# Patient Record
Sex: Female | Born: 1985 | Race: White | Hispanic: No | Marital: Married | Smoking: Current every day smoker
Health system: Southern US, Community
[De-identification: ages and names within clinical notes are randomized; demographics above are authoritative.]

## PROBLEM LIST (undated history)

## (undated) ENCOUNTER — Inpatient Hospital Stay: Payer: Self-pay

## (undated) HISTORY — PX: KNEE SURGERY: SHX244

## (undated) HISTORY — PX: JOINT REPLACEMENT: SHX530

---
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
## 2010-08-05 ENCOUNTER — Emergency Department: Payer: Self-pay | Admitting: Emergency Medicine

## 2010-11-24 ENCOUNTER — Emergency Department: Payer: Self-pay | Admitting: Unknown Physician Specialty

## 2011-05-19 ENCOUNTER — Observation Stay: Payer: Self-pay | Admitting: Obstetrics and Gynecology

## 2011-05-31 ENCOUNTER — Observation Stay: Payer: Self-pay | Admitting: Obstetrics and Gynecology

## 2011-06-05 ENCOUNTER — Inpatient Hospital Stay: Payer: Self-pay

## 2011-11-05 IMAGING — US US OB < 14 WEEKS
1 series · 17 of 28 positions shown · non-contrast
Comparison: none

REASON FOR EXAM: pregnancy with spotting
COMMENTS:

[Series 1: us ob < 14 weeks · 17 of 94 slices shown]
[im 1/94]
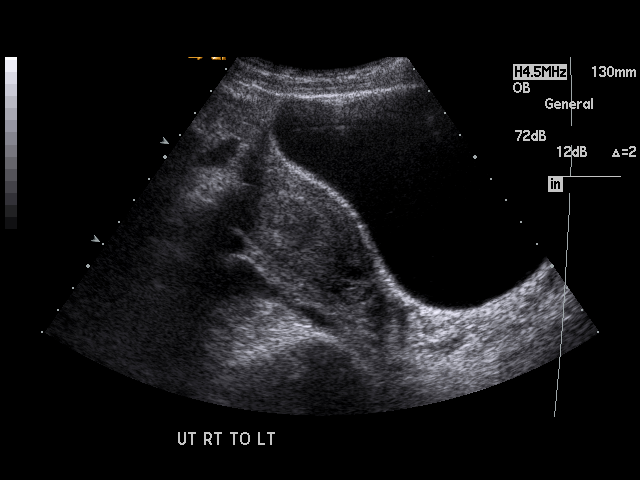
[im 7/94]
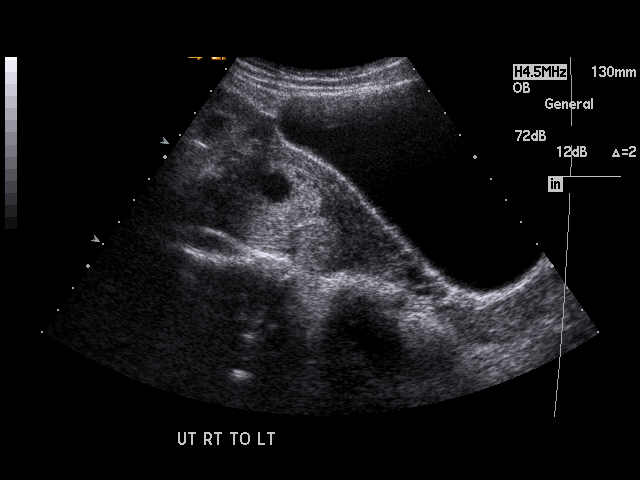
[im 14/94]
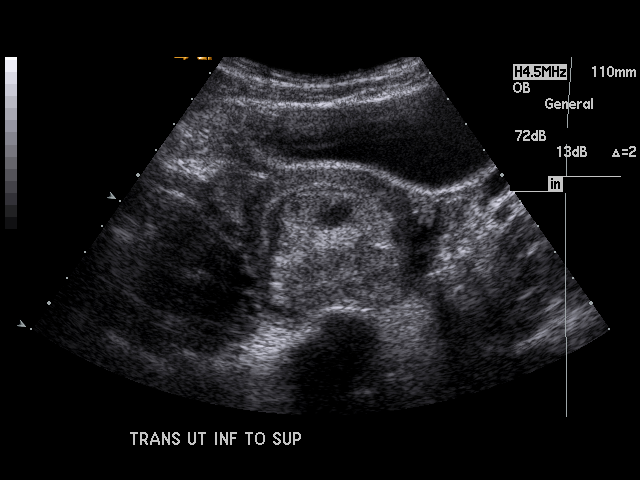
[im 18/94]
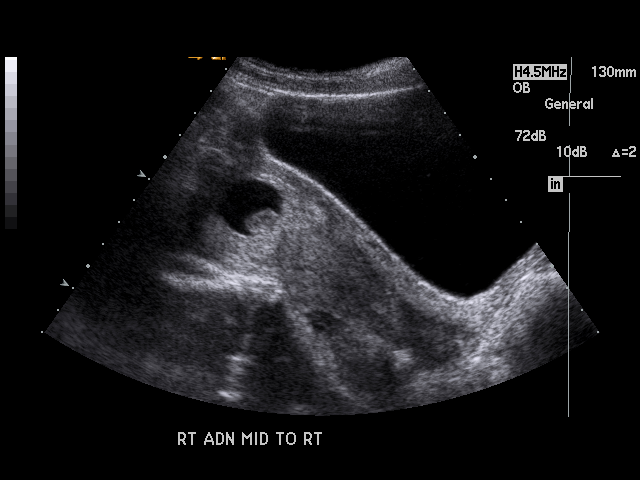
[im 25/94]
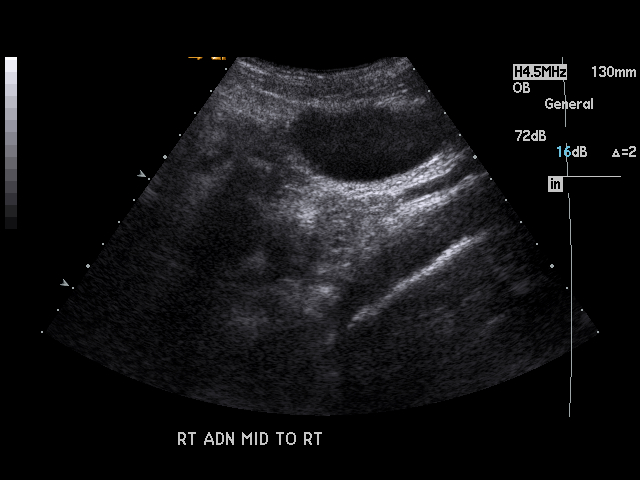
[im 32/94]
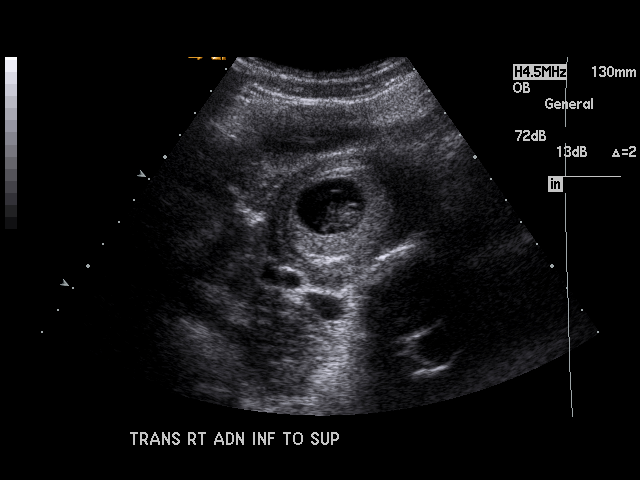
[im 35/94]
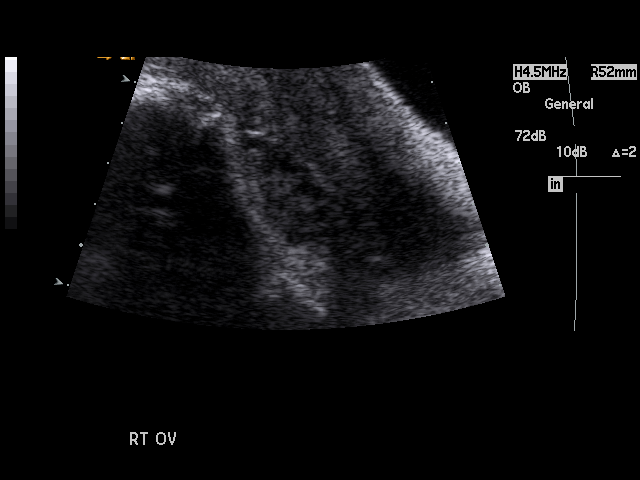
[im 42/94]
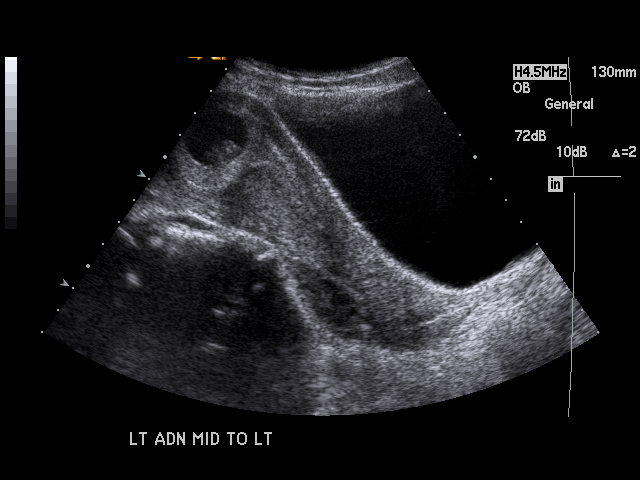
[im 49/94]
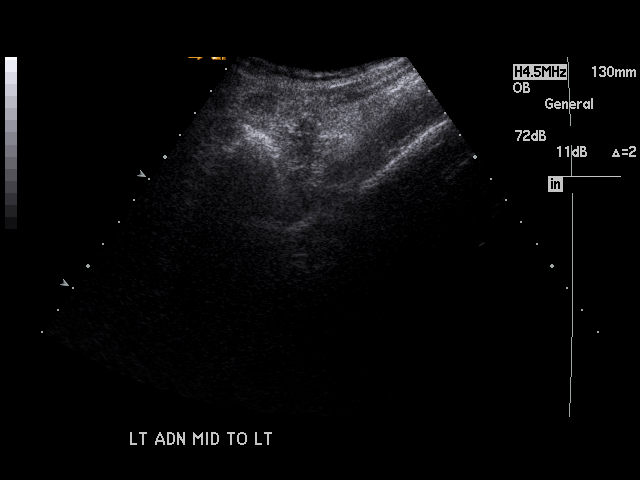
[im 52/94]
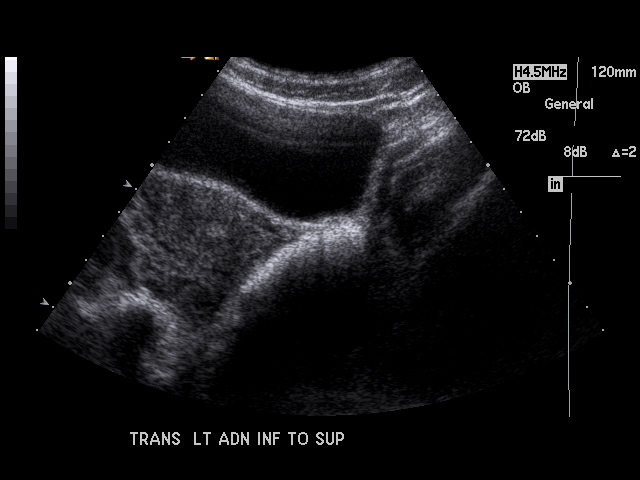
[im 59/94]
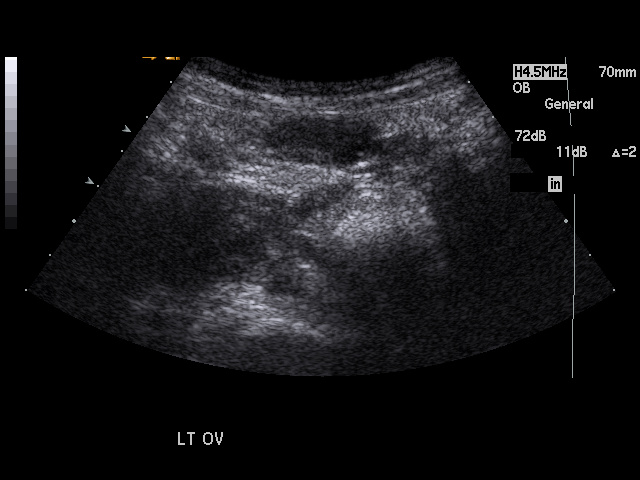
[im 63/94]
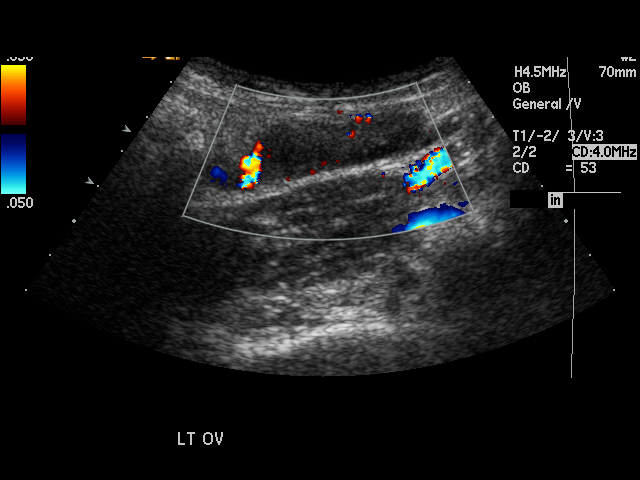
[im 69/94]
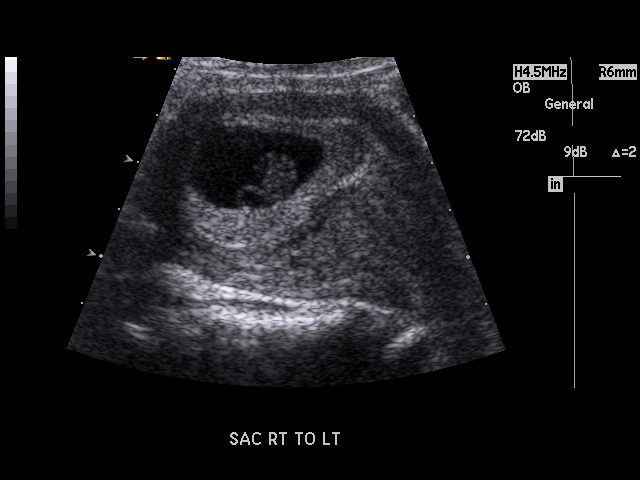
[im 76/94]
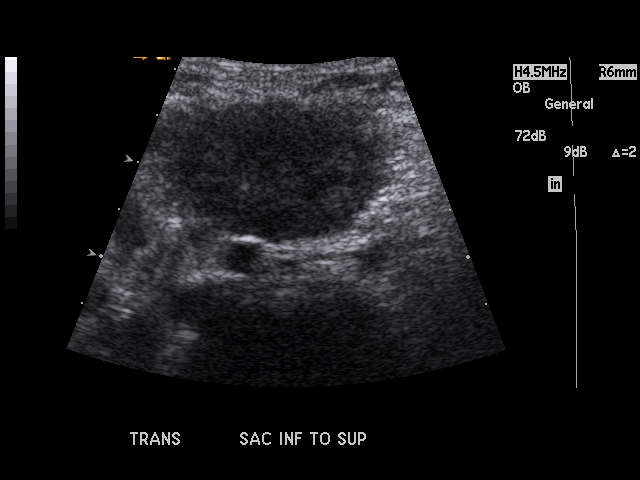
[im 80/94]
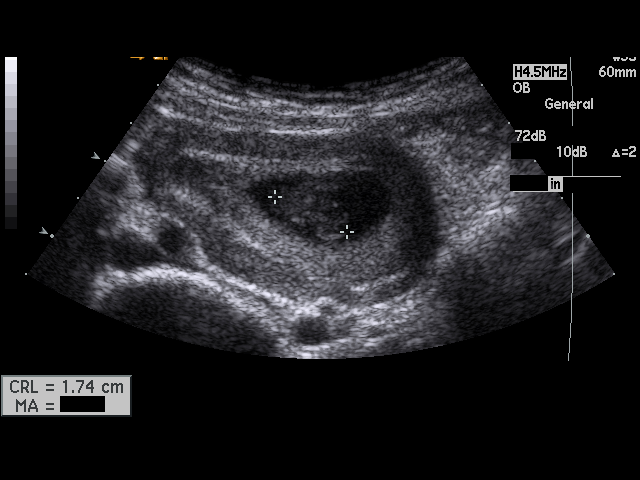
[im 87/94]
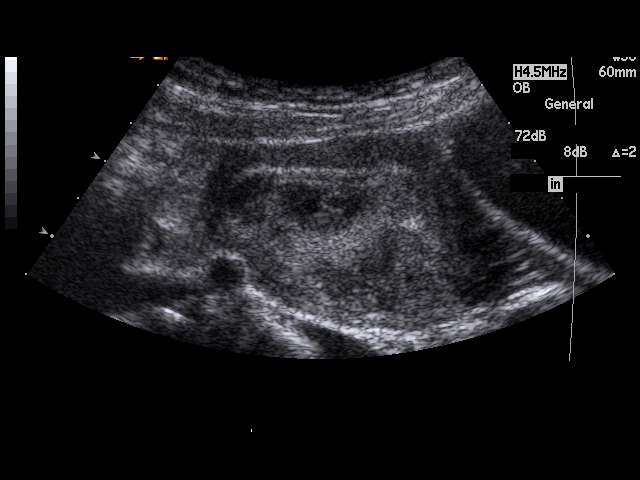
[im 94/94]
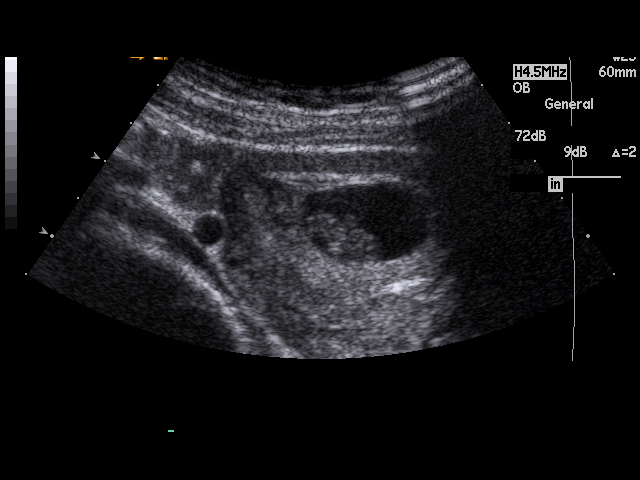

[17 of 28 positions shown; findings below may reference images not displayed]

PROCEDURE:     US  - US OB LESS THAN 14 WEEKS  - November 24, 2010 [DATE]

RESULT:     There is observed a living intrauterine gestation. Embryo heart
rate was monitored at 167 beats per minute. The crown rump length measures
1.79 cm which corresponds to an estimated menstrual age of 8 weeks-1 days.
The yolk sac is visualized. The right and left maternal ovaries are
visualized and are normal in appearance. No abnormal adnexal masses are seen
in the maternal pelvis.
IMPRESSION: 1.     There is a living intrauterine gestation of approximately 8 weeks-1
days menstrual age. Ultrasound estimated EDD based on crown-rump length is
July 04, 2011.

## 2013-09-04 ENCOUNTER — Emergency Department: Payer: Self-pay | Admitting: Emergency Medicine

## 2013-09-05 LAB — COMPREHENSIVE METABOLIC PANEL
ANION GAP: 3 — AB (ref 7–16)
Albumin: 4.6 g/dL (ref 3.4–5.0)
Alkaline Phosphatase: 72 U/L
BUN: 7 mg/dL (ref 7–18)
Bilirubin,Total: 0.4 mg/dL (ref 0.2–1.0)
Calcium, Total: 9.4 mg/dL (ref 8.5–10.1)
Chloride: 107 mmol/L (ref 98–107)
Co2: 31 mmol/L (ref 21–32)
Creatinine: 0.67 mg/dL (ref 0.60–1.30)
EGFR (African American): >60
EGFR (Non-African Amer.): >60
Glucose: 82 mg/dL (ref 65–99)
Osmolality: 278 (ref 275–301)
POTASSIUM: 3.6 mmol/L (ref 3.5–5.1)
SGOT(AST): 12 U/L — ABNORMAL LOW (ref 15–37)
SGPT (ALT): 24 U/L (ref 12–78)
SODIUM: 141 mmol/L (ref 136–145)
Total Protein: 8.3 g/dL — ABNORMAL HIGH (ref 6.4–8.2)

## 2013-09-05 LAB — URINALYSIS, COMPLETE
BACTERIA: NONE SEEN
BLOOD: NEGATIVE
Bilirubin,UR: NEGATIVE
GLUCOSE, UR: NEGATIVE mg/dL (ref 0–75)
Ketone: NEGATIVE
LEUKOCYTE ESTERASE: NEGATIVE
Nitrite: NEGATIVE
Ph: 7 (ref 4.5–8.0)
Protein: NEGATIVE
RBC, UR: NONE SEEN /HPF (ref 0–5)
SPECIFIC GRAVITY: 1.004 (ref 1.003–1.030)

## 2013-09-05 LAB — CBC WITH DIFFERENTIAL/PLATELET
BASOS PCT: 0.6 %
Basophil #: 0.1 10*3/uL (ref 0.0–0.1)
EOS ABS: 0.1 10*3/uL (ref 0.0–0.7)
Eosinophil %: 1.6 %
HCT: 41.4 % (ref 35.0–47.0)
HGB: 13.6 g/dL (ref 12.0–16.0)
Lymphocyte #: 2.2 10*3/uL (ref 1.0–3.6)
Lymphocyte %: 23.4 %
MCH: 30.9 pg (ref 26.0–34.0)
MCHC: 33 g/dL (ref 32.0–36.0)
MCV: 94 fL (ref 80–100)
MONO ABS: 0.7 x10 3/mm (ref 0.2–0.9)
MONOS PCT: 7.5 %
NEUTROS ABS: 6.3 10*3/uL (ref 1.4–6.5)
Neutrophil %: 66.9 %
PLATELETS: 221 10*3/uL (ref 150–440)
RBC: 4.42 10*6/uL (ref 3.80–5.20)
RDW: 14.3 % (ref 11.5–14.5)
WBC: 9.4 10*3/uL (ref 3.6–11.0)

## 2013-09-05 LAB — LIPASE, BLOOD: Lipase: 92 U/L (ref 73–393)

## 2016-06-09 DIAGNOSIS — D069 Carcinoma in situ of cervix, unspecified: Secondary | ICD-10-CM | POA: Insufficient documentation

## 2017-06-15 ENCOUNTER — Ambulatory Visit: Payer: Self-pay | Admitting: Family Medicine

## 2019-05-19 ENCOUNTER — Other Ambulatory Visit: Payer: Self-pay

## 2019-05-19 DIAGNOSIS — Z1231 Encounter for screening mammogram for malignant neoplasm of breast: Secondary | ICD-10-CM

## 2019-09-05 DIAGNOSIS — F419 Anxiety disorder, unspecified: Secondary | ICD-10-CM | POA: Insufficient documentation

## 2021-01-31 ENCOUNTER — Encounter: Payer: Self-pay | Admitting: Emergency Medicine

## 2021-01-31 ENCOUNTER — Emergency Department
Admission: EM | Admit: 2021-01-31 | Discharge: 2021-01-31 | Disposition: A | Discharge status: Home / Self Care | Payer: Medicaid Other | Attending: Emergency Medicine | Admitting: Emergency Medicine

## 2021-01-31 ENCOUNTER — Other Ambulatory Visit: Payer: Self-pay

## 2021-01-31 ENCOUNTER — Ambulatory Visit
Admit: 2021-01-31 | Discharge: 2021-01-31 | Disposition: A | Discharge status: Home / Self Care | Payer: Medicaid Other | Attending: Internal Medicine | Admitting: Internal Medicine

## 2021-01-31 ENCOUNTER — Other Ambulatory Visit: Payer: Self-pay | Admitting: Obstetrics & Gynecology

## 2021-01-31 ENCOUNTER — Ambulatory Visit
Admission: EM | Admit: 2021-01-31 | Discharge: 2021-01-31 | Disposition: A | Discharge status: Home / Self Care | Payer: Medicaid Other | Attending: Physician Assistant | Admitting: Physician Assistant

## 2021-01-31 DIAGNOSIS — Z3201 Encounter for pregnancy test, result positive: Secondary | ICD-10-CM | POA: Insufficient documentation

## 2021-01-31 DIAGNOSIS — N9489 Other specified conditions associated with female genital organs and menstrual cycle: Secondary | ICD-10-CM | POA: Diagnosis not present

## 2021-01-31 DIAGNOSIS — Z20822 Contact with and (suspected) exposure to covid-19: Secondary | ICD-10-CM | POA: Diagnosis not present

## 2021-01-31 DIAGNOSIS — O26859 Spotting complicating pregnancy, unspecified trimester: Secondary | ICD-10-CM | POA: Diagnosis present

## 2021-01-31 DIAGNOSIS — N838 Other noninflammatory disorders of ovary, fallopian tube and broad ligament: Secondary | ICD-10-CM | POA: Insufficient documentation

## 2021-01-31 DIAGNOSIS — O209 Hemorrhage in early pregnancy, unspecified: Secondary | ICD-10-CM | POA: Insufficient documentation

## 2021-01-31 DIAGNOSIS — Z3A08 8 weeks gestation of pregnancy: Secondary | ICD-10-CM | POA: Diagnosis not present

## 2021-01-31 DIAGNOSIS — Z87891 Personal history of nicotine dependence: Secondary | ICD-10-CM | POA: Insufficient documentation

## 2021-01-31 DIAGNOSIS — O26851 Spotting complicating pregnancy, first trimester: Secondary | ICD-10-CM | POA: Diagnosis not present

## 2021-01-31 DIAGNOSIS — O009 Unspecified ectopic pregnancy without intrauterine pregnancy: Secondary | ICD-10-CM | POA: Diagnosis present

## 2021-01-31 DIAGNOSIS — N939 Abnormal uterine and vaginal bleeding, unspecified: Secondary | ICD-10-CM | POA: Diagnosis present

## 2021-01-31 LAB — CBC WITH DIFFERENTIAL/PLATELET
Abs Immature Granulocytes: 0.03 10*3/uL (ref 0.00–0.07)
Basophils Absolute: 0.1 10*3/uL (ref 0.0–0.1)
Basophils Relative: 1 %
Eosinophils Absolute: 0.1 10*3/uL (ref 0.0–0.5)
Eosinophils Relative: 1 %
HCT: 41.7 % (ref 36.0–46.0)
Hemoglobin: 14.3 g/dL (ref 12.0–15.0)
Immature Granulocytes: 0 %
Lymphocytes Relative: 24 %
Lymphs Abs: 2.5 10*3/uL (ref 0.7–4.0)
MCH: 31.4 pg (ref 26.0–34.0)
MCHC: 34.3 g/dL (ref 30.0–36.0)
MCV: 91.6 fL (ref 80.0–100.0)
Monocytes Absolute: 0.5 10*3/uL (ref 0.1–1.0)
Monocytes Relative: 5 %
Neutro Abs: 7.1 10*3/uL (ref 1.7–7.7)
Neutrophils Relative %: 69 %
Platelets: 218 10*3/uL (ref 150–400)
RBC: 4.55 MIL/uL (ref 3.87–5.11)
RDW: 12.4 % (ref 11.5–15.5)
WBC: 10.2 10*3/uL (ref 4.0–10.5)
nRBC: 0 % (ref 0.0–0.2)

## 2021-01-31 LAB — COMPREHENSIVE METABOLIC PANEL
ALT: 21 U/L (ref 0–44)
AST: 20 U/L (ref 15–41)
Albumin: 5.3 g/dL — ABNORMAL HIGH (ref 3.5–5.0)
Alkaline Phosphatase: 48 U/L (ref 38–126)
Anion gap: 6 (ref 5–15)
BUN: 10 mg/dL (ref 6–20)
CO2: 23 mmol/L (ref 22–32)
Calcium: 9.2 mg/dL (ref 8.9–10.3)
Chloride: 108 mmol/L (ref 98–111)
Creatinine, Ser: 0.72 mg/dL (ref 0.44–1.00)
GFR, Estimated: >60 mL/min (ref >60)
Glucose, Bld: 107 mg/dL — ABNORMAL HIGH (ref 70–99)
Potassium: 3.4 mmol/L — ABNORMAL LOW (ref 3.5–5.1)
Sodium: 137 mmol/L (ref 135–145)
Total Bilirubin: 1.3 mg/dL — ABNORMAL HIGH (ref 0.3–1.2)
Total Protein: 8 g/dL (ref 6.5–8.1)

## 2021-01-31 LAB — RESP PANEL BY RT-PCR (FLU A&B, COVID) ARPGX2
Influenza A by PCR: NEGATIVE
Influenza B by PCR: NEGATIVE
SARS Coronavirus 2 by RT PCR: NEGATIVE

## 2021-01-31 LAB — HCG, QUANTITATIVE, PREGNANCY: hCG, Beta Chain, Quant, S: 307 m[IU]/mL — ABNORMAL HIGH (ref <5)

## 2021-01-31 LAB — PREGNANCY, URINE: Preg Test, Ur: POSITIVE — AB

## 2021-01-31 NOTE — ED Triage Notes (Signed)
Pt arrives POV. D/T pain in lower pelvic region pt presented Urgent Care where provider stated ectopic pregnancy and referred to ED. Pt states dull discomfort in rt pelvic area.

## 2021-01-31 NOTE — Progress Notes (Signed)
Called by Dr. Scotty Court regarding patient.  Patient had a pelvic ultrasound performed at outside urgent care for abdominal pain in the setting of early pregnancy.  Patient's ultrasound showed a suspicious right adnexal mass however a fetal pole and fetal heart rate were not seen.  Images were not able to be reviewed as they are not loaded.  Beta-hCG was 307.  Patient reports the first day of her last menstrual period was 01/19/2021.  She reports that she took a home pregnancy test on 01/21/2021 which was negative.  She reports that her first positive pregnancy test was on 01/28/2021.  This is a desired pregnancy.  She is not currently having severe pain.  Her vital signs are stable.  Her hemoglobin was stable.  No evidence of hemoperitoneum was seen on the ultrasound.  We reviewed in detail the ultrasound results and her beta hCG.  We discussed the risks associated with ectopic pregnancy and discussed ectopic pregnancy precautions including pelvic rest..  She currently has a pregnancy of unknown location.  We discussed that she will need to follow-up for repeat laboratory testing in 48 hours to assess how the beta-hCG hormone is rising.  If it is rising in a normal fashion it would need to be monitored at regular intervals and a follow-up ultrasound will be planned in 1 to 2 weeks.  If there is an abnormality in the rate of beta-hCG hormone rise methotrexate or surgery can be considered.    If there is any change to her pain, any dizziness or lightheadedness, any fainting or any heavy vaginal bleeding she is to return urgently to the emergency room.We discussed that ectopic pregnancies can be life threatening and that regular follow up will be needed. She did not express limitations to follow up.   Will plan to see patient tomorrow in office at 1:15 PM and will arrange repeat beta hcg testing and MTX therapy/surgery if needed.  Adelene Idler MD, [facility], [facility] 01/31/2021 7:54 PM

## 2021-01-31 NOTE — ED Notes (Signed)
No prior auth required per Pt insurance.

## 2021-01-31 NOTE — ED Notes (Signed)
MD Scotty Court made aware of pt triage CC and status at this time.

## 2021-01-31 NOTE — ED Provider Notes (Signed)
[facility]    CSN: [id number] Arrival date & time: 01/31/21  1332      History   Chief Complaint Chief Complaint  Patient presents with   Possible Pregnancy    HPI Cynthia Jackson is a 35 y.o. female.   Patient is a 35 year old female G2, P2 who presents with possible pregnancy.  Patient reports she has had "weird "menstrual cycles recently.  She states June 15 with her last regular period.  She states she had a "weird ".  Around August 3 that was very light.  She states she took a pregnancy test this past Friday, 8/12, that returned positive.  She states she has had some light spotting since then, reporting a few drops of blood over the time she goes the bathroom.  She also reports some lower abdominal pain on the right last week but nothing last couple days.  Patient denies any complications with her previous 2 pregnancies but states that her last child was born 3 weeks early.  Patient states she has an appointment with Dr. Bonney Aid at [facility] on 8/24 to establish care.   History reviewed. No pertinent past medical history.  There are no problems to display for this patient.   Past Surgical History:  Procedure Laterality Date   KNEE SURGERY Left     OB History     Gravida  1   Para      Term      Preterm      AB      Living         SAB      IAB      Ectopic      Multiple      Live Births               Home Medications    Prior to Admission medications   Not on File    Family History No family history on file.  Social History Social History   Tobacco Use   Smoking status: Former    Types: Cigarettes   Smokeless tobacco: Never  Vaping Use   Vaping Use: Never used  Substance Use Topics   Alcohol use: Not Currently   Drug use: Not Currently    Types: Marijuana     Allergies   Patient has no known allergies.   Review of Systems Review of Systems as noted above in HPI.  Other systems reviewed and found  to be negative   Physical Exam Triage Vital Signs ED Triage Vitals  Enc Vitals Group     BP 01/31/21 1427 124/69     Pulse Rate 01/31/21 1427 63     Resp 01/31/21 1427 18     Temp 01/31/21 1427 98.5 F (36.9 C)     Temp Source 01/31/21 1427 Oral     SpO2 01/31/21 1427 100 %     Weight 01/31/21 1424 110 lb (49.9 kg)     Height 01/31/21 1424 5\' 1"  (1.549 m)     Head Circumference --      Peak Flow --      Pain Score 01/31/21 1424 0     Pain Loc --      Pain Edu? --      Excl. in GC? --    No data found.  Updated Vital Signs BP 124/69 (BP Location: Right Arm)   Pulse 63   Temp 98.5 F (36.9 C) (Oral)   Resp 18  Ht 5\' 1"  (1.549 m)   Wt 110 lb (49.9 kg)   LMP 12/31/2020   SpO2 100%   BMI 20.78 kg/m    Physical Exam Constitutional:      Appearance: Normal appearance. She is normal weight. She is not ill-appearing.  Cardiovascular:     Rate and Rhythm: Normal rate.     Heart sounds: Normal heart sounds.  Pulmonary:     Effort: Pulmonary effort is normal.  Abdominal:     General: Abdomen is flat. There is no distension.     Palpations: Abdomen is soft. There is no mass.     Tenderness: There is no abdominal tenderness. There is no guarding or rebound.  Skin:    General: Skin is warm and dry.     Capillary Refill: Capillary refill takes less than 2 seconds.  Neurological:     General: No focal deficit present.     Mental Status: She is alert and oriented to person, place, and time.  Psychiatric:        Mood and Affect: Mood normal.        Behavior: Behavior normal.     UC Treatments / Results  Labs (all labs ordered are listed, but only abnormal results are displayed) Labs Reviewed  PREGNANCY, URINE - Abnormal; Notable for the following components:      Result Value   Preg Test, Ur POSITIVE (*)    All other components within normal limits    EKG   Radiology 01/02/2021 OB Transvaginal  Result Date: 01/31/2021 CLINICAL DATA:  Vaginal bleeding in early  pregnancy for 3 days. EXAM: TRANSVAGINAL OB ULTRASOUND TECHNIQUE: Transvaginal ultrasound was performed for complete evaluation of the gestation as well as the maternal uterus, adnexal regions, and pelvic cul-de-sac. COMPARISON:  None. FINDINGS: Intrauterine gestational sac: None Maternal uterus/adnexae: Endometrial thickness measures 4 mm. No fibroids identified. Both ovaries are normal in appearance. A small heterogeneous mass is seen in the right adnexa which is separate from the ovary. This measures 2.1 x 1.4 x 1.2 cm. No evidence of free fluid. IMPRESSION: 2.1 cm right adnexal mass, highly suspicious for ectopic pregnancy. No evidence of hemoperitoneum. Critical Value/emergent results were called by telephone at the time of interpretation on 01/31/2021 at 5:10 pm to provider [facility], who verbally acknowledged these results. Electronically Signed   By: [facility] M.D.   On: 01/31/2021 17:10    Procedures Procedures (including critical care time)  Medications Ordered in UC Medications - No data to display  Initial Impression / Assessment and Plan / UC Course  I have reviewed the triage vital signs and the nursing notes.  Pertinent labs & imaging results that were available during my care of the patient were reviewed by me and considered in my medical decision making (see chart for details).    Patient presents with chief complaint irregular periods and a positive urine test.  Patient states she had an episode of right lower quadrant abdominal pain last week but nothing over the last couple days.  Patient also reports intermittent spotting last few days with a few drops of blood every other time she goes to the bathroom.  Patient reports no complications from her 2 previous 2 pregnancies. LMP of 6/15 gives gestational age of 8w 5d and [facility] 09/07/2021.  Spoke with Dr. 09/09/2021 with [facility] as Dr. [facility] is out of the office. He said some spotting can be normal but cannot rule out a  miscarriage. He recommended ordering  OB US and to keep her appointment for 8/24.   [place] as above concerning for unruptured ectopic pregnancy.  Patient notified and advised of need to go to the ER for immediate treatment.  She was advised of this being a life-threatening event should this rupture with her outside of the hospital.  Patient is understanding.  Visibly upset stating that they have been trying 10 units for another child.  She will be having a friend come to take her directly to the hospital.  I have offered her an ambulance as I do not believe it is safe for her to drive herself.  Final Clinical Impressions(s) / UC Diagnoses   Final diagnoses:  Positive pregnancy test  Spotting during pregnancy  Ectopic pregnancy without intrauterine pregnancy, unspecified location     Discharge Instructions      -An ectopic pregnancy was noted on the [place] but there or no signs of rupture at this time.  -Will need to go immediately to the ER for treatment.     ED Prescriptions   None    PDMP not reviewed this encounter.   Candis Schatz, PA-C 01/31/21 1724

## 2021-01-31 NOTE — Discharge Instructions (Addendum)
-  An ectopic pregnancy was noted on the [place] but there or no signs of rupture at this time.  -Will need to go immediately to the ER for treatment.

## 2021-01-31 NOTE — ED Triage Notes (Signed)
Pt states she took a home pregnancy test and was positive on 01/28/21. She states she started bleeding the same day. She states she is not having heavy bleeding but is concerned.

## 2021-01-31 NOTE — Discharge Instructions (Addendum)
Please follow up with [facility] tomorrow to continue evaluating your pregnancy and monitoring your symptoms.

## 2021-01-31 NOTE — ED Provider Notes (Signed)
[facility] Emergency Department Provider Note  ____________________________________________  Time seen: Approximately 8:20 PM  I have reviewed the triage vital signs and the nursing notes.   HISTORY  Chief Complaint Ectopic Pregnancy    HPI Cynthia Jackson is a 35 y.o. female who complains of low abdominal pain and light vaginal bleeding for the past 3 days.  She was seen in urgent care earlier today, found to be pregnant, ultrasound showed right adnexal mass and no IUP, she was sent to the emergency department for evaluation of possible ectopic pregnancy.  No fever chest pain shortness of breath.  No blood thinner use.  No dysuria.  This is a desired pregnancy.  LMP was June 15    History reviewed. No pertinent past medical history.   There are no problems to display for this patient.    Past Surgical History:  Procedure Laterality Date   KNEE SURGERY Left      Prior to Admission medications   Not on File     Allergies Other   History reviewed. No pertinent family history.  Social History Social History   Tobacco Use   Smoking status: Former    Packs/day: 0.50    Years: 32.00    Pack years: 16.00    Types: Cigarettes    Quit date: 01/28/2021   Smokeless tobacco: Never  Vaping Use   Vaping Use: Never used  Substance Use Topics   Alcohol use: Never   Drug use: Never    Types: Marijuana    Review of Systems  Constitutional:   No fever or chills.  ENT:   No sore throat. No rhinorrhea. Cardiovascular:   No chest pain or syncope. Respiratory:   No dyspnea or cough. Gastrointestinal:   Positive for abdominal pain without vomiting and diarrhea.  Musculoskeletal:   Negative for focal pain or swelling All other systems reviewed and are negative except as documented above in ROS and HPI.  ____________________________________________   PHYSICAL EXAM:  VITAL SIGNS: ED Triage Vitals  Enc Vitals Group     BP 01/31/21  1816 (!) 123/59     Pulse Rate 01/31/21 1816 83     Resp 01/31/21 1816 15     Temp 01/31/21 1816 98.5 F (36.9 C)     Temp Source 01/31/21 1816 Oral     SpO2 01/31/21 1816 100 %     Weight 01/31/21 1817 110 lb (49.9 kg)     Height 01/31/21 1817 5\' 1"  (1.549 m)     Head Circumference --      Peak Flow --      Pain Score 01/31/21 1817 0     Pain Loc --      Pain Edu? --      Excl. in GC? --     Vital signs reviewed, nursing assessments reviewed.   Constitutional:   Alert and oriented. Non-toxic appearance. Eyes:   Conjunctivae are normal. EOMI.  ENT      Head:   Normocephalic and atraumatic.      Nose:   Wearing a mask.      Mouth/Throat:   Wearing a mask.      Neck:   No meningismus. Full ROM. Cardiovascular:   RRR. Cap refill less than 2 seconds. Respiratory:   Normal respiratory effort without tachypnea/retractions.  Gastrointestinal:   Soft and nontender. Non distended.   No rebound, rigidity, or guarding.  Musculoskeletal:   Normal range of motion in all extremities. No joint  effusions.  No lower extremity tenderness.  No edema. Neurologic:   Normal speech and language.  Motor grossly intact. No acute focal neurologic deficits are appreciated.  Skin:    Skin is warm, dry and intact. No rash noted.  No petechiae, purpura, or bullae.  ____________________________________________    LABS (pertinent positives/negatives) (all labs ordered are listed, but only abnormal results are displayed) Labs Reviewed  COMPREHENSIVE METABOLIC PANEL - Abnormal; Notable for the following components:      Result Value   Potassium 3.4 (*)    Glucose, Bld 107 (*)    Albumin 5.3 (*)    Total Bilirubin 1.3 (*)    All other components within normal limits  HCG, QUANTITATIVE, PREGNANCY - Abnormal; Notable for the following components:   hCG, Beta Chain, Quant, S 307 (*)    All other components within normal limits  RESP PANEL BY RT-PCR (FLU A&B, COVID) ARPGX2  CBC WITH  DIFFERENTIAL/PLATELET  TYPE AND SCREEN  TYPE AND SCREEN   ____________________________________________   EKG    ____________________________________________    RADIOLOGY  US OB Transvaginal  Result Date: 01/31/2021 CLINICAL DATA:  Vaginal bleeding in early pregnancy for 3 days. EXAM: TRANSVAGINAL OB ULTRASOUND TECHNIQUE: Transvaginal ultrasound was performed for complete evaluation of the gestation as well as the maternal uterus, adnexal regions, and pelvic cul-de-sac. COMPARISON:  None. FINDINGS: Intrauterine gestational sac: None Maternal uterus/adnexae: Endometrial thickness measures 4 mm. No fibroids identified. Both ovaries are normal in appearance. A small heterogeneous mass is seen in the right adnexa which is separate from the ovary. This measures 2.1 x 1.4 x 1.2 cm. No evidence of free fluid. IMPRESSION: 2.1 cm right adnexal mass, highly suspicious for ectopic pregnancy. No evidence of hemoperitoneum. Critical Value/emergent results were called by telephone at the time of interpretation on 01/31/2021 at 5:10 pm to provider [facility], who verbally acknowledged these results. Electronically Signed   By: Danae Orleans M.D.   On: 01/31/2021 17:10    ____________________________________________   PROCEDURES Procedures  ____________________________________________    CLINICAL IMPRESSION / ASSESSMENT AND PLAN / ED COURSE  Medications ordered in the ED: Medications - No data to display  Pertinent labs & imaging results that were available during my care of the patient were reviewed by me and considered in my medical decision making (see chart for details).  Cynthia Jackson was evaluated in Emergency Department on 01/31/2021 for the symptoms described in the history of present illness. She was evaluated in the context of the global COVID-19 pandemic, which necessitated consideration that the patient might be at risk for infection with the SARS-CoV-2 virus that causes  COVID-19. Institutional protocols and algorithms that pertain to the evaluation of patients at risk for COVID-19 are in a state of rapid change based on information released by regulatory bodies including the CDC and federal and state organizations. These policies and algorithms were followed during the patient's care in the ED.   Patient presents with outside ultrasound showing right adnexal mass measuring 2 cm.  No free fluid  Clinical Course as of 01/31/21 2020  Mon Jan 31, 2021  1846 Case discussed with OB, will need to wait for hCG level to return. [PS]    Clinical Course User Index [PS] Sharman Cheek, MD    ----------------------------------------- 8:22 PM on 01/31/2021 ----------------------------------------- Case discussed with radiology who notes that the right adnexal mass appears nondescript soft tissue, no fetal pole or cardiac difficulty or definitive embryo structures.  However, they do feel  it is highly likely to be ectopic/tubal pregnancy.  Discussed with Dr. Gaynelle Arabian from [facility] who had extensive conversation with the patient.  She will plan to see them in clinic tomorrow, repeat hCG and ultrasound and further counseled on.  Vital signs are normal, hemoglobin is normal, there is no free fluid, no evidence of rupture.  Pain is mild and patient is ambulatory, she is stable for outpatient follow-up at this time, return precautions have been discussed.   ____________________________________________   FINAL CLINICAL IMPRESSION(S) / ED DIAGNOSES    Final diagnoses:  Adnexal mass     ED Discharge Orders     None       Portions of this note were generated with dragon dictation software. Dictation errors may occur despite best attempts at proofreading.    Sharman Cheek, MD 01/31/21 2024

## 2021-02-01 ENCOUNTER — Telehealth: Payer: Self-pay

## 2021-02-01 ENCOUNTER — Encounter: Payer: Self-pay | Admitting: Obstetrics and Gynecology

## 2021-02-01 ENCOUNTER — Other Ambulatory Visit (HOSPITAL_COMMUNITY)
Admission: RE | Admit: 2021-02-01 | Discharge: 2021-02-01 | Disposition: A | Discharge status: Home / Self Care | Payer: Medicaid Other | Source: Ambulatory Visit | Attending: Obstetrics and Gynecology | Admitting: Obstetrics and Gynecology

## 2021-02-01 ENCOUNTER — Ambulatory Visit (INDEPENDENT_AMBULATORY_CARE_PROVIDER_SITE_OTHER): Payer: Medicaid Other | Admitting: Obstetrics and Gynecology

## 2021-02-01 VITALS — BP 102/68 | Ht 61.0 in | Wt 106.4 lb

## 2021-02-01 DIAGNOSIS — O3680X0 Pregnancy with inconclusive fetal viability, not applicable or unspecified: Secondary | ICD-10-CM

## 2021-02-01 DIAGNOSIS — Z124 Encounter for screening for malignant neoplasm of cervix: Secondary | ICD-10-CM | POA: Diagnosis not present

## 2021-02-01 NOTE — Telephone Encounter (Signed)
Attempt to reach patient via phone. Voicemail is not set up unable to leave message 

## 2021-02-01 NOTE — Patient Instructions (Addendum)
Enter thru the [facility], 2PM appointment in the PACU/PreOp area   Methotrexate Treatment for an Ectopic Pregnancy Methotrexate is a medicine that treats an ectopic pregnancy. In this type of pregnancy, the fertilized egg attaches (implants) outside the uterus. An ectopic pregnancy cannot develop into a healthy baby. Methotrexate works by stopping the growth of the fertilized egg. It also helpsthe body absorb tissue from the egg. This takes about 2-6 weeks. An ectopic pregnancy can be life-threatening. However, most ectopic pregnanciescan be successfully treated with methotrexate if they are diagnosed early. Tell a health care provider about: Any allergies you have. All medicines you are taking, including vitamins, herbs, eye drops, creams, and over-the-counter medicines. Any medical conditions you have. What are the risks? Generally, this is a safe treatment. However, problems may occur, including: Digestive problems. You may have: Nausea. Vomiting. Diarrhea. Cramping in your abdomen. Bleeding or spotting from your vagina. Feeling dizzy or light-headed. Mouth sores. Inflammation of the lining of your lungs (pneumonitis). Damage to nearby structures or organs, such as damage to the liver. Hair loss. There is a risk that methotrexate treatment will fail and the pregnancy will continue. There is also a risk that the ectopic pregnancy might tear or burst (rupture) during use of this medicine. What happens before the procedure? Blood tests will be done to check how your disease-fighting system (immune system), liver, and kidneys are working. You will also have blood tests to measure your pregnancy hormone levels and to find out your blood type. You will be given a shot of a medicine called Rho(D) immune globulin if: You are Rh-negative and the father is Rh-positive. You are Rh-negative and the father's Rh type is unknown. What happens during the procedure? Methotrexate will be  injected into your muscle. Methotrexate may be given as a single dose of medicine or a series of doses over time, depending on your response to the treatment. Methotrexate injections are given by a health care provider. Injection is the most common way that this medicine is used to treat an ectopic pregnancy. You may also receive other medicines to manage your ectopic pregnancy. The procedure may vary among health care providers and hospitals. What can I expect after treatment? After your treatment, it is common to have: Cramping in your abdomen. Bleeding in your vagina. Tiredness (fatigue). Nausea. Vomiting. Diarrhea. Blood tests will be done at timed intervals for several days or weeks to check your pregnancy hormone levels. The blood tests will be done until the pregnancyhormone can no longer be found in the blood. If the methotrexate treatment does not work, a surgical procedure may be doneto remove the ectopic pregnancy. Follow these instructions at home:  Medicines Take over-the-counter and prescription medicines only as told by your health care provider. Do not take prescription pain medicines, aspirin, ibuprofen, naproxen, or any other NSAIDs. Do not take folic acid, prenatal vitamins, or other vitamins that contain folic acid. Activity Do not have sex, douche, or put anything, such as tampons, in your vagina until your health care provider says it is okay. Limit activities that take a lot of effort as told by your health care provider. General instructions Do not drink alcohol. Follow instructions from your health care provider about eating restrictions, such as avoiding foods that produce a lot of gas. These foods can hide the signs of a ruptured ectopic pregnancy. Limit exposure to sunlight or artificial UV light such as from tanning beds. Methotrexate can make you more sensitive to the sun.  Follow instructions from your health care provider on how and when to report any  symptoms that may indicate a ruptured ectopic pregnancy. Keep all follow-up visits. This is important. Contact a health care provider if: You have persistent nausea and vomiting. You have persistent diarrhea. You are having a reaction to the medicine. This may include: Unusual fatigue. Skin rash. Get help right away if: Pain in your abdomen or in the area between your hip bones (pelvic area) gets worse. You have more bleeding from your vagina. You feel light-headed or you faint. You are short of breath. Your heart rate increases. You develop a cough. You have chills or a fever. Summary Methotrexate is a medicine that treats an ectopic pregnancy. This type of pregnancy forms outside the uterus. There is a risk that methotrexate treatment will fail and the pregnancy will continue. There is also a risk that the ectopic pregnancy might tear or burst during use of this medicine. This medicine may be given in a single dose or a series of doses over time. After your treatment, blood tests will be done at timed intervals for several days or weeks to check your pregnancy hormone levels. The blood tests will be done until no more pregnancy hormone is found in the blood. This information is not intended to replace advice given to you by your health care provider. Make sure you discuss any questions you have with your healthcare provider. Document Revised: 11/19/2019 Document Reviewed: 11/19/2019 Elsevier Patient Education  2022 [employer].

## 2021-02-01 NOTE — Telephone Encounter (Signed)
Attempt to reach patient via phone. Voicemail is not set up unable to leave message

## 2021-02-01 NOTE — Telephone Encounter (Signed)
-----   Message from Natale Milch, MD sent at 01/31/2021  7:17 PM EDT ----- This patient needs to get worked into my schedule on 02/01/2021 at 1:15 pm.

## 2021-02-01 NOTE — Progress Notes (Signed)
Patient ID: Cynthia Jackson, female   DOB: 02-Jan-1986, 35 y.o.   MRN: [id number]  Reason for Consult: No chief complaint on file.   Referred by No ref. provider found  Subjective:     HPI:  Cynthia Jackson is a 35 y.o. female.  She is following up from the ER yesterday.  She had a pelvic ultrasound at her urgent care which was suggestive of a possible ectopic pregnancy.  She does desire pregnancy and so plan is for repeat beta-hCG tomorrow with decision to then be made regarding methotrexate versus surgery if the value is abnormal.  She is currently feeling well.  She denies severe abdominal pain.  Gynecological History  Patient's last menstrual period was 12/31/2020.  No past medical history on file. No family history on file. Past Surgical History:  Procedure Laterality Date   KNEE SURGERY Left     Short Social History:  Social History   Tobacco Use   Smoking status: Former    Packs/day: 0.50    Years: 32.00    Pack years: 16.00    Types: Cigarettes    Quit date: 01/28/2021    Years since quitting: 0.0   Smokeless tobacco: Never  Substance Use Topics   Alcohol use: Never    Allergies  Allergen Reactions   Other Anaphylaxis    Fresh and vegetables allergy    No current outpatient medications on file.   No current facility-administered medications for this visit.    Review of Systems  Constitutional: Negative for chills, fatigue, fever and unexpected weight change.  HENT: Negative for trouble swallowing.  Eyes: Negative for loss of vision.  Respiratory: Negative for cough, shortness of breath and wheezing.  Cardiovascular: Negative for chest pain, leg swelling, palpitations and syncope.  GI: Negative for abdominal pain, blood in stool, diarrhea, nausea and vomiting.  GU: Negative for difficulty urinating, dysuria, frequency and hematuria.  Musculoskeletal: Negative for back pain, leg pain and joint pain.  Skin: Negative for rash.  Neurological:  Negative for dizziness, headaches, light-headedness, numbness and seizures.  Psychiatric: Negative for behavioral problem, confusion, depressed mood and sleep disturbance.       Objective:  Objective   Vitals:   02/01/21 1316  BP: 102/68  Weight: 106 lb 6.4 oz (48.3 kg)  Height: 5\' 1"  (1.549 m)   Body mass index is 20.1 kg/m.  Physical Exam Vitals and nursing note reviewed. Exam conducted with a chaperone present.  Constitutional:      Appearance: Normal appearance. She is well-developed.  HENT:     Head: Normocephalic and atraumatic.  Eyes:     Extraocular Movements: Extraocular movements intact.     Pupils: Pupils are equal, round, and reactive to light.  Cardiovascular:     Rate and Rhythm: Normal rate and regular rhythm.  Pulmonary:     Effort: Pulmonary effort is normal. No respiratory distress.     Breath sounds: Normal breath sounds.  Abdominal:     General: Abdomen is flat.     Palpations: Abdomen is soft.  Genitourinary:    Comments: External: Normal appearing vulva. No lesions noted.  Speculum examination: Normal appearing cervix. Small blood in the vaginal vault. Bimanual examination: Uterus midline, non-tender, normal in size, shape and contour.  No CMT. No adnexal masses. No adnexal tenderness. Pelvis not fixed.  Breast exam: exam not performed Musculoskeletal:        General: No signs of injury.  Skin:    General: Skin is warm  and dry.  Neurological:     Mental Status: She is alert and oriented to person, place, and time.  Psychiatric:        Behavior: Behavior normal.        Thought Content: Thought content normal.        Judgment: Judgment normal.    Assessment/Plan:     35 year old with pregnancy of unknown location. Reviewed the plan of care and follow-up with the patient.  Patient will have labs performed tomorrow in the PACU area an appointment time was made for her.  We discussed the risks of ectopic pregnancy.  Pelvic rest was strongly  recommended. We reviewed treatment options for ectopic pregnancy. Patient desires tubal conservation if possible.   More than 30 minutes were spent face to face with the patient in the room, reviewing the medical record, labs and images, and coordinating care for the patient. The plan of management was discussed in detail and counseling was provided.    Adelene Idler MD [facility], [facility] 02/01/2021 1:38 PM

## 2021-02-02 ENCOUNTER — Ambulatory Visit
Admission: RE | Admit: 2021-02-02 | Discharge: 2021-02-02 | Disposition: A | Discharge status: Home / Self Care | Payer: Medicaid Other | Source: Ambulatory Visit | Attending: Obstetrics and Gynecology | Admitting: Obstetrics and Gynecology

## 2021-02-02 ENCOUNTER — Other Ambulatory Visit: Payer: Self-pay

## 2021-02-02 DIAGNOSIS — O00101 Right tubal pregnancy without intrauterine pregnancy: Secondary | ICD-10-CM | POA: Diagnosis not present

## 2021-02-02 DIAGNOSIS — Z3A00 Weeks of gestation of pregnancy not specified: Secondary | ICD-10-CM | POA: Insufficient documentation

## 2021-02-02 LAB — CBC WITH DIFFERENTIAL/PLATELET
Abs Immature Granulocytes: 0.01 10*3/uL (ref 0.00–0.07)
Basophils Absolute: 0 10*3/uL (ref 0.0–0.1)
Basophils Relative: 1 %
Eosinophils Absolute: 0.1 10*3/uL (ref 0.0–0.5)
Eosinophils Relative: 1 %
HCT: 39 % (ref 36.0–46.0)
Hemoglobin: 13.4 g/dL (ref 12.0–15.0)
Immature Granulocytes: 0 %
Lymphocytes Relative: 39 %
Lymphs Abs: 2.4 10*3/uL (ref 0.7–4.0)
MCH: 31.5 pg (ref 26.0–34.0)
MCHC: 34.4 g/dL (ref 30.0–36.0)
MCV: 91.8 fL (ref 80.0–100.0)
Monocytes Absolute: 0.4 10*3/uL (ref 0.1–1.0)
Monocytes Relative: 7 %
Neutro Abs: 3.2 10*3/uL (ref 1.7–7.7)
Neutrophils Relative %: 52 %
Platelets: 183 10*3/uL (ref 150–400)
RBC: 4.25 MIL/uL (ref 3.87–5.11)
RDW: 12.3 % (ref 11.5–15.5)
WBC: 6.1 10*3/uL (ref 4.0–10.5)
nRBC: 0 % (ref 0.0–0.2)

## 2021-02-02 LAB — HCG, QUANTITATIVE, PREGNANCY: hCG, Beta Chain, Quant, S: 131 m[IU]/mL — ABNORMAL HIGH (ref <5)

## 2021-02-02 MED ORDER — METHOTREXATE SODIUM CHEMO INJECTION (PF) 50 MG/2ML
50.0000 mg/m2 | Freq: Once | INTRAMUSCULAR | Status: AC
  Administered 2021-02-02: 72 mg via INTRAMUSCULAR
  Filled 2021-02-02: qty 2.88

## 2021-02-02 NOTE — Progress Notes (Signed)
Patients labs back. Called Dr Jerene Pitch, she is speaking with the patient.

## 2021-02-02 NOTE — Discharge Instructions (Signed)
Following Methotrexate Administration for Ectopic Pregnancy  Your physician will obtain follow-up blood work to monitor the effect of the medication.   After receiving methotrexate avoid:  Alcoholic beverages  Vitamins containing folic acid Foods that contain folic acid, including fortified cereal, enriched bread and pasta, peanuts, dark green leafy vegetables, orange juice, and beans  Gas-forming foods  Nonsteroidal antiinflammatory painkillers  Sexual intercourse or any strenuous activity because it may cause the fallopian tube to rupture Do not become pregnant for 3 months to decrease the risk of birth defects.  You may experience side effects, like nausea, vomiting, dizziness, and mouth and lip ulcers.  Most women have abdominal pain a couple of days after the injection.  Notify your obstetric practitioner or return to the emergency department if you develop severe abdominal pain, dizziness or fainting, heavy vaginal bleeding, severe nausea and vomiting, or fever.  Double-flush the toilet with the lid closed for 72 hours after receiving the injection.   

## 2021-02-02 NOTE — Progress Notes (Signed)
Called from preoperative area with beta-hCG result.  Beta-hCG has unfortunately decreased.  This is consistent with miscarriage or ectopic pregnancy.  Given patient's pain and stricture seen in right adnexa we discussed treatment options for ectopic pregnancy.  We discussed possibility of salpingectomy versus methotrexate therapy.  Patient desires conservative therapy with methotrexate so that she can hopefully retain her fallopian tube.  She is available for follow-up with repeat of lab work.  She does not have any known contraindications to methotrexate therapy.  She has been provided with information regarding methotrexate therapy.  She will follow-up for day 4 and day 7 labs on 8/20 and 8/23.  Close follow-up is planned.  Methotrexate was ordered.  Adelene Idler MD, [facility], [facility] 02/02/2021 3:16 PM

## 2021-02-05 ENCOUNTER — Other Ambulatory Visit: Payer: Self-pay

## 2021-02-05 ENCOUNTER — Ambulatory Visit
Admission: RE | Admit: 2021-02-05 | Discharge: 2021-02-05 | Disposition: A | Discharge status: Home / Self Care | Payer: Medicaid Other | Source: Ambulatory Visit | Attending: Obstetrics and Gynecology | Admitting: Obstetrics and Gynecology

## 2021-02-05 DIAGNOSIS — Z01812 Encounter for preprocedural laboratory examination: Secondary | ICD-10-CM | POA: Insufficient documentation

## 2021-02-05 LAB — CBC
HCT: 36.6 % (ref 36.0–46.0)
Hemoglobin: 12.9 g/dL (ref 12.0–15.0)
MCH: 32.1 pg (ref 26.0–34.0)
MCHC: 35.2 g/dL (ref 30.0–36.0)
MCV: 91 fL (ref 80.0–100.0)
Platelets: 177 10*3/uL (ref 150–400)
RBC: 4.02 MIL/uL (ref 3.87–5.11)
RDW: 12.1 % (ref 11.5–15.5)
WBC: 5.2 10*3/uL (ref 4.0–10.5)
nRBC: 0 % (ref 0.0–0.2)

## 2021-02-05 LAB — HCG, QUANTITATIVE, PREGNANCY: hCG, Beta Chain, Quant, S: 51 m[IU]/mL — ABNORMAL HIGH (ref <5)

## 2021-02-05 NOTE — Progress Notes (Signed)
Periop note Medical Day: Pt here for labs (CBC, beta hCG). Pt aware to return for f/u labs in three days. Pt states she feels much better now than last week. Lab results called to Dr. Jerene Pitch. Pt left perioperative area to f/u with Dr. Jerene Pitch.

## 2021-02-07 LAB — CYTOLOGY - PAP
Chlamydia: NEGATIVE
Comment: NEGATIVE
Comment: NEGATIVE
Comment: NEGATIVE
Comment: NEGATIVE
Comment: NORMAL
Diagnosis: HIGH — AB
HPV 16: NEGATIVE
HPV 18 / 45: NEGATIVE
High risk HPV: POSITIVE — AB
Neisseria Gonorrhea: NEGATIVE
Trichomonas: NEGATIVE

## 2021-02-08 ENCOUNTER — Other Ambulatory Visit: Payer: Self-pay

## 2021-02-08 ENCOUNTER — Ambulatory Visit
Admission: RE | Admit: 2021-02-08 | Discharge: 2021-02-08 | Disposition: A | Discharge status: Home / Self Care | Payer: Medicaid Other | Source: Ambulatory Visit | Attending: Obstetrics and Gynecology | Admitting: Obstetrics and Gynecology

## 2021-02-08 DIAGNOSIS — Z01812 Encounter for preprocedural laboratory examination: Secondary | ICD-10-CM | POA: Insufficient documentation

## 2021-02-08 LAB — CBC
HCT: 35.6 % — ABNORMAL LOW (ref 36.0–46.0)
Hemoglobin: 12.1 g/dL (ref 12.0–15.0)
MCH: 31.6 pg (ref 26.0–34.0)
MCHC: 34 g/dL (ref 30.0–36.0)
MCV: 93 fL (ref 80.0–100.0)
Platelets: 158 10*3/uL (ref 150–400)
RBC: 3.83 MIL/uL — ABNORMAL LOW (ref 3.87–5.11)
RDW: 12.5 % (ref 11.5–15.5)
WBC: 4.7 10*3/uL (ref 4.0–10.5)
nRBC: 0 % (ref 0.0–0.2)

## 2021-02-08 LAB — HCG, QUANTITATIVE, PREGNANCY: hCG, Beta Chain, Quant, S: 19 m[IU]/mL — ABNORMAL HIGH (ref <5)

## 2021-02-09 ENCOUNTER — Encounter: Payer: Self-pay | Admitting: Obstetrics and Gynecology

## 2021-02-09 NOTE — Telephone Encounter (Signed)
-----   Message from Natale Milch, MD sent at 02/08/2021  4:26 PM EDT ----- Can you convert this patient's visit for 8/29 to a colposcopy? You can cancel her NOB appointment with [facility] on 8/24.  Thank you,  Dr. Jerene Pitch

## 2021-02-09 NOTE — Telephone Encounter (Signed)
Voicemail no set up. Appointment for 02/09/21 Cancelled

## 2021-02-14 ENCOUNTER — Other Ambulatory Visit: Payer: Self-pay

## 2021-02-14 ENCOUNTER — Ambulatory Visit: Payer: Medicaid Other | Admitting: Obstetrics and Gynecology

## 2021-02-14 ENCOUNTER — Other Ambulatory Visit (HOSPITAL_COMMUNITY)
Admission: RE | Admit: 2021-02-14 | Discharge: 2021-02-14 | Disposition: A | Discharge status: Home / Self Care | Payer: Medicaid Other | Source: Ambulatory Visit | Attending: Obstetrics and Gynecology | Admitting: Obstetrics and Gynecology

## 2021-02-14 ENCOUNTER — Encounter: Payer: Self-pay | Admitting: Obstetrics and Gynecology

## 2021-02-14 ENCOUNTER — Ambulatory Visit (INDEPENDENT_AMBULATORY_CARE_PROVIDER_SITE_OTHER): Payer: Medicaid Other | Admitting: Obstetrics and Gynecology

## 2021-02-14 VITALS — BP 110/60 | Ht 61.0 in | Wt 110.0 lb

## 2021-02-14 DIAGNOSIS — R87613 High grade squamous intraepithelial lesion on cytologic smear of cervix (HGSIL): Secondary | ICD-10-CM | POA: Diagnosis present

## 2021-02-14 DIAGNOSIS — O3680X0 Pregnancy with inconclusive fetal viability, not applicable or unspecified: Secondary | ICD-10-CM

## 2021-02-14 NOTE — Progress Notes (Signed)
   GYNECOLOGY CLINIC COLPOSCOPY PROCEDURE NOTE  35 y.o. G1P0 here for colposcopy for high-grade squamous intraepithelial neoplasia  (HGSIL-encompassing moderate and severe dysplasia)  pap smear on 02/01/2021. Discussed underlying role for HPV infection in the development of cervical dysplasia, its natural history and progression/regression, need for surveillance.  Is the patient  pregnant: no LMP: Patient's last menstrual period was 01/19/2021. Smoking status:  reports that she quit smoking about 2 weeks ago. Her smoking use included cigarettes. She has a 16.00 pack-year smoking history. She has never used smokeless tobacco. Future fertility desired:  Yes  Patient given informed consent, signed copy in the chart, time out was performed.  The patient was position in dorsal lithotomy position. Speculum was placed the cervix was visualized.   After application of acetic acid colposcopic inspection of the cervix was undertaken.   Colposcopy adequate, full visualization of transformation zone: Yes no visible lesions, biopsies at 10,2, and 6 o'clock ; corresponding biopsies obtained.   ECC specimen obtained:  Yes  All specimens were labeled and sent to pathology.   Patient was given post procedure instructions.  Will follow up pathology and manage accordingly.  Routine preventative health maintenance measures emphasized.  Physical Exam Constitutional:      Appearance: She is well-developed.  Genitourinary:     Neck:     Thyroid: No thyromegaly.  Abdominal:     General: There is no distension.     Palpations: There is no mass.  Neurological:     Mental Status: She is alert and oriented to person, place, and time.  Skin:    General: Skin is warm and dry.  Psychiatric:        Behavior: Behavior normal.        Thought Content: Thought content normal.        Judgment: Judgment normal.  Vitals reviewed.    Patient has been undergoing treatment for her pregnancy of unknown location.  She  received methotrexate.  Labs today to trend beta hCGs.  Her last beta-hCG last week was 19. She reports that she is feeling well and is not having symptoms of abdominal pain dizziness or lightheadedness.  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    Adelene Idler MD [facility], [facility] 02/14/2021 10:31 AM

## 2021-02-14 NOTE — Patient Instructions (Signed)
Colposcopy, Care After This sheet gives you information about how to care for yourself after your procedure. Your doctor may also give you more specific instructions. If youhave problems or questions, contact your doctor. What can I expect after the procedure? If you did not have a sample of your tissue taken out (did not have a biopsy), you may only have some spotting of blood for a few days. You can go back toyour normal activities. If you had a sample of your tissue taken out, it is common to have: Soreness and mild pain. These may last for a few days. A light-headed feeling. Mild bleeding or fluid (discharge) coming from your vagina. The fluid will look dark and grainy. You may have this for a few days. The fluid may be caused by a liquid that was used during your procedure. You may need to wear a sanitary pad. Spotting of blood for at least 48 hours after the procedure. Follow these instructions at home: Medicines Take over-the-counter and prescription medicines only as told by your doctor. Ask your doctor what medicines you can start taking again. This is very important if you take blood thinners. Activity Limit your activity for the first day after your procedure as told by your doctor. For at least 3 days, or for as long as told by your doctor, avoid: Douching. Using tampons. Having sex. Return to your normal activities as told by your doctor. Ask your doctor what activities are safe for you. General instructions  Drink enough fluid to keep your pee (urine) pale yellow. Ask your doctor if you may take baths, swim, or use a hot tub. You may take showers. If you use birth control (contraception), keep using it. Keep all follow-up visits as told by your doctor. This is important.  Contact a doctor if: You get a skin rash. Get help right away if: You bleed a lot from your vagina. A lot of bleeding means you use more than one pad an hour for 2 hours in a row. You have clumps of  blood (blood clots) coming from your vagina. You have a fever or chills. You have signs of infection. This may be fluid coming from your vagina that is: Different than normal. Yellow. Bad-smelling. You have very bad pain or cramps in your lower belly that do not get better with medicine. You faint. Summary If you did not have a sample of your tissue taken out, you may only have some spotting of blood for a few days. You can go back to your normal activities. If you had a sample of your tissue taken out, it is common to have mild pain for a few days and spotting for 48 hours. Avoid douching, using tampons, and having sex for at least 3 days after the procedure or for as long as told. Get help right away if you have a lot of bleeding, very bad pain, or signs of infection. This information is not intended to replace advice given to you by your health care provider. Make sure you discuss any questions you have with your healthcare provider. Document Revised: 04/06/2020 Document Reviewed: 06/04/2019 Elsevier Patient Education  2022 [employer]  

## 2021-02-15 LAB — BETA HCG QUANT (REF LAB): hCG Quant: 5 m[IU]/mL

## 2021-02-18 LAB — SURGICAL PATHOLOGY

## 2021-05-05 ENCOUNTER — Encounter: Payer: Self-pay | Admitting: Obstetrics and Gynecology

## 2021-05-06 ENCOUNTER — Other Ambulatory Visit: Payer: Self-pay | Admitting: Obstetrics & Gynecology

## 2021-05-06 DIAGNOSIS — O3680X0 Pregnancy with inconclusive fetal viability, not applicable or unspecified: Secondary | ICD-10-CM

## 2021-05-09 ENCOUNTER — Other Ambulatory Visit: Payer: Medicaid Other

## 2021-05-09 ENCOUNTER — Other Ambulatory Visit: Payer: Self-pay

## 2021-05-09 DIAGNOSIS — O3680X0 Pregnancy with inconclusive fetal viability, not applicable or unspecified: Secondary | ICD-10-CM

## 2021-05-10 LAB — BETA HCG QUANT (REF LAB): hCG Quant: 787 m[IU]/mL

## 2021-05-11 ENCOUNTER — Other Ambulatory Visit: Payer: Medicaid Other

## 2021-05-11 ENCOUNTER — Other Ambulatory Visit: Payer: Self-pay

## 2021-05-12 LAB — BETA HCG QUANT (REF LAB): hCG Quant: 1639 m[IU]/mL

## 2021-05-12 NOTE — Progress Notes (Signed)
Sch NOB soon.

## 2021-05-17 ENCOUNTER — Encounter: Payer: Self-pay | Admitting: Licensed Practical Nurse

## 2021-05-17 ENCOUNTER — Other Ambulatory Visit: Payer: Self-pay

## 2021-05-17 ENCOUNTER — Ambulatory Visit (INDEPENDENT_AMBULATORY_CARE_PROVIDER_SITE_OTHER): Payer: Medicaid Other | Admitting: Licensed Practical Nurse

## 2021-05-17 VITALS — BP 112/68 | Ht 61.0 in | Wt 108.8 lb

## 2021-05-17 DIAGNOSIS — Z3480 Encounter for supervision of other normal pregnancy, unspecified trimester: Secondary | ICD-10-CM | POA: Insufficient documentation

## 2021-05-17 DIAGNOSIS — Z369 Encounter for antenatal screening, unspecified: Secondary | ICD-10-CM

## 2021-05-17 DIAGNOSIS — Z113 Encounter for screening for infections with a predominantly sexual mode of transmission: Secondary | ICD-10-CM

## 2021-05-17 DIAGNOSIS — Z3A01 Less than 8 weeks gestation of pregnancy: Secondary | ICD-10-CM

## 2021-05-17 LAB — POCT URINALYSIS DIPSTICK OB
Glucose, UA: NEGATIVE
POC,PROTEIN,UA: NEGATIVE

## 2021-05-17 NOTE — Progress Notes (Signed)
New Obstetric Patient H&P    Chief Complaint: "Desires prenatal care"   History of Present Illness: Patient is a 35 y.o. G2P0 Not Hispanic or Latino female, presents with amenorrhea and positive home pregnancy test. Patient's last menstrual period was 04/15/2021. and based on her  LMP, her EDD is Estimated Date of Delivery: 01/20/22 and her EGA is [redacted]w[redacted]d. Cycles are  Irregular -sometimes as long as 2 months, lasting 5-7 days, will a "normal" flow.   She was treated with Methotrexate for an ectopic pregnancy in August . Her last menstrual period was normal. Since her LMP,she has experienced spotting on the day of her missed period and breast tenderness. She denies vaginal bleeding. Her past medical history is noncontributory. Her prior pregnancies are notable for none. Denies hx of Depression and anxiety-although noted elsewhere in the chart.   Since her LMP, she admits to the use of tobacco products  Has decreased to 2 cigarettes a day, admits to MJ use in pregnancy, denies alcohol  She claims her prepregnancy weight is 105 pounds  There are cats in the home in the home  no  She admits close contact with children on a regular basis  no  She has had chicken pox in the past yes She has had Tuberculosis exposures, symptoms, or previously tested positive for TB   no Current or past history of domestic violence. no  Genetic Screening/Teratology Counseling: (Includes patient, baby's father, or anyone in either family with:)   1. Patient's age >/= 23 [facility]  no 2. Thalassemia ([place], [place], Mediterranean, or Asian background): MCV<80  no 3. Neural tube defect (meningomyelocele, spina bifida, anencephaly)  no 4. Congenital heart defect  no  5. Down syndrome  no 6. Tay-Sachs (Jewish, [place]  no 7. Canavan's Disease  no 8. Sickle cell disease or trait (African)  no  9. Hemophilia or other blood disorders  no  10. Muscular dystrophy  no  11. Cystic fibrosis  no  12. Huntington's Chorea   no  13. Mental retardation/autism  no 14. Other inherited genetic or chromosomal disorder  no 15. Maternal metabolic disorder (DM, PKU, etc)  no 16. Patient or FOB with a child with a birth defect not listed above no  16a. Patient or FOB with a birth defect themselves no 17. Recurrent pregnancy loss, or stillbirth  no  18. Any medications since LMP other than prenatal vitamins (include vitamins, supplements, OTC meds, drugs, alcohol)  no 19. Any other genetic/environmental exposure to discuss  no  Her last child was born with 6 toes   Infection History:   1. Lives with someone with TB or TB exposed  no  2. Patient or partner has history of genital herpes  no 3. Rash or viral illness since LMP  no 4. History of STI (GC, CT, HPV, syphilis, HIV)  14 years ago was treated with a 10 day antibiotic  5. History of recent travel :  no  Other pertinent information:  no     Review of Systems:10 point review of systems negative unless otherwise noted in HPI  Past Medical History:  Patient Active Problem List   Diagnosis Date Noted  . Supervision of other normal pregnancy, antepartum 05/17/2021     Nursing Staff Provider  Office Location  [facility] Dating    Language  English  Anatomy US    Flu Vaccine   Genetic Screen  NIPS:   TDaP vaccine    Hgb A1C or  GTT  Early : Third trimester :   Covid    LAB RESULTS   Rhogam   Blood Type     Feeding Plan breast Antibody    Contraception  Rubella    Circumcision  RPR     Pediatrician   HBsAg     Support Person Dustin  HIV    Prenatal Classes  Varicella     GBS  (For PCN allergy, check sensitivities)   BTL Consent     VBAC Consent  Pap      Hgb Electro    Pelvis Tested  CF      SMA              Past Surgical History:  Past Surgical History:  Procedure Laterality Date  . KNEE SURGERY Left     Gynecologic History: Patient's last menstrual period was 04/15/2021.  Obstetric History: G2P0  Family History:  History reviewed.  No pertinent family history. Does not know her biological family history very well, her mother's grandmother had Breast Cancer  Social History:  Social History   Socioeconomic History  . Marital status: Married    Spouse name: Not on file  . Number of children: Not on file  . Years of education: Not on file  . Highest education level: Not on file  Occupational History  . Not on file  Tobacco Use  . Smoking status: Former    Packs/day: 0.50    Years: 32.00    Pack years: 16.00    Types: Cigarettes    Quit date: 01/28/2021    Years since quitting: 0.2  . Smokeless tobacco: Never  Vaping Use  . Vaping Use: Never used  Substance and Sexual Activity  . Alcohol use: Never  . Drug use: Never    Types: Marijuana  . Sexual activity: Yes  Other Topics Concern  . Not on file  Social History Narrative  . Not on file   Social Determinants of Health   Financial Resource Strain: Not on file  Food Insecurity: Not on file  Transportation Needs: Not on file  Physical Activity: Not on file  Stress: Not on file  Social Connections: Not on file  Intimate Partner Violence: Not on file    Allergies:  Allergies  Allergen Reactions  . Other Anaphylaxis    Fresh and vegetables allergy    Medications: Prior to Admission medications   Medication Sig Start Date End Date Taking? Authorizing Provider  prenatal vitamin w/FE, FA (PRENATAL 1 + 1) 27-1 MG TABS tablet Take 1 tablet by mouth daily at 12 noon.   Yes [provider]    Physical Exam Vitals: Blood pressure 112/68, height 5\' 1"  (1.549 m), weight 108 lb 12.8 oz (49.4 kg), last menstrual period 04/15/2021.  General: NAD HEENT: normocephalic, anicteric Thyroid: no enlargement, no palpable nodules Pulmonary: No increased work of breathing, CTAB Cardiovascular: RRR, distal pulses 2+ Abdomen: NABS, soft, non-tender, non-distended.  Umbilicus without lesions.  No hepatomegaly, splenomegaly or masses palpable. No evidence of  hernia  Genitourinary:  External: Normal external female genitalia.  Normal urethral meatus, normal  Bartholin's and Skene's glands.    Vagina: Normal vaginal mucosa, no evidence of prolapse.    Cervix: Grossly normal in appearance, no bleeding  Uterus: about 4-[redacted] weeks gestation, mobile, normal contour.  No CMT  Adnexa: ovaries non-enlarged, no adnexal masses  Rectal: deferred Extremities: no edema, erythema, or tenderness Neurologic: Grossly intact Psychiatric: mood appropriate, affect full   Assessment: 35 y.o. G2P0  at [redacted]w[redacted]d presenting to initiate prenatal care  Plan: 1) Avoid alcoholic beverages. 2) Patient encouraged not to smoke.  3) Discontinue the use of all non-medicinal drugs and chemicals.  4) Take prenatal vitamins daily.  5) Nutrition, food safety (fish, cheese advisories, and high nitrite foods) and exercise discussed. 6) Hospital and practice style discussed with cross coverage system.  7) Genetic Screening, such as with 1st Trimester Screening, cell free fetal DNA, AFP testing, and Ultrasound, as well as with amniocentesis and CVS as appropriate, is discussed with patient. At the conclusion of today's visit patient undecided genetic testing 8) Patient is asked about travel to areas at risk for the [place] virus, and counseled to avoid travel and exposure to mosquitoes or sexual partners who may have themselves been exposed to the virus. Testing is discussed, and will be ordered as appropriate.  9) Return in 1-2 weeks for dating [place].  Carie Caddy, CNM  [facility], [facility] 05/17/2021, 4:52 PM

## 2021-05-18 LAB — OBSTETRIC PANEL, INCLUDING HIV
Antibody Screen: NEGATIVE
Basophils Absolute: 0 10*3/uL (ref 0.0–0.2)
Basos: 1 %
EOS (ABSOLUTE): 0.1 10*3/uL (ref 0.0–0.4)
Eos: 1 %
HIV Screen 4th Generation wRfx: NONREACTIVE
Hematocrit: 38.8 % (ref 34.0–46.6)
Hemoglobin: 12.8 g/dL (ref 11.1–15.9)
Hepatitis B Surface Ag: NEGATIVE
Immature Grans (Abs): 0 10*3/uL (ref 0.0–0.1)
Immature Granulocytes: 0 %
Lymphocytes Absolute: 2 10*3/uL (ref 0.7–3.1)
Lymphs: 26 %
MCH: 30.9 pg (ref 26.6–33.0)
MCHC: 33 g/dL (ref 31.5–35.7)
MCV: 94 fL (ref 79–97)
Monocytes Absolute: 0.4 10*3/uL (ref 0.1–0.9)
Monocytes: 5 %
Neutrophils Absolute: 5.3 10*3/uL (ref 1.4–7.0)
Neutrophils: 67 %
Platelets: 197 10*3/uL (ref 150–450)
RBC: 4.14 x10E6/uL (ref 3.77–5.28)
RDW: 12.1 % (ref 11.7–15.4)
RPR Ser Ql: NONREACTIVE
Rh Factor: POSITIVE
Rubella Antibodies, IGG: 1.17 index (ref >0.99)
WBC: 7.8 10*3/uL (ref 3.4–10.8)

## 2021-05-18 LAB — VARICELLA ZOSTER ANTIBODY, IGG: Varicella zoster IgG: 1217 index (ref >165)

## 2021-05-18 LAB — HEPATITIS C ANTIBODY: Hep C Virus Ab: <0.1 s/co ratio (ref 0.0–0.9)

## 2021-05-19 LAB — GC/CHLAMYDIA PROBE AMP
Chlamydia trachomatis, NAA: NEGATIVE
Neisseria Gonorrhoeae by PCR: NEGATIVE

## 2021-05-20 LAB — URINE CULTURE

## 2021-05-23 ENCOUNTER — Other Ambulatory Visit: Payer: Self-pay | Admitting: Licensed Practical Nurse

## 2021-05-23 DIAGNOSIS — Z3A01 Less than 8 weeks gestation of pregnancy: Secondary | ICD-10-CM

## 2021-05-23 LAB — TOXASSURE SELECT 13 (MW), URINE

## 2021-05-24 ENCOUNTER — Ambulatory Visit (INDEPENDENT_AMBULATORY_CARE_PROVIDER_SITE_OTHER): Payer: Medicaid Other

## 2021-05-24 ENCOUNTER — Encounter: Payer: Self-pay | Admitting: Advanced Practice Midwife

## 2021-05-24 ENCOUNTER — Other Ambulatory Visit: Payer: Self-pay | Admitting: Licensed Practical Nurse

## 2021-05-24 ENCOUNTER — Other Ambulatory Visit: Payer: Self-pay

## 2021-05-24 ENCOUNTER — Ambulatory Visit (INDEPENDENT_AMBULATORY_CARE_PROVIDER_SITE_OTHER): Payer: Medicaid Other | Admitting: Advanced Practice Midwife

## 2021-05-24 ENCOUNTER — Ambulatory Visit (INDEPENDENT_AMBULATORY_CARE_PROVIDER_SITE_OTHER): Payer: Medicaid Other | Admitting: Obstetrics and Gynecology

## 2021-05-24 VITALS — BP 100/60 | Wt 107.0 lb

## 2021-05-24 DIAGNOSIS — Z3A01 Less than 8 weeks gestation of pregnancy: Secondary | ICD-10-CM

## 2021-05-24 DIAGNOSIS — O0911 Supervision of pregnancy with history of ectopic or molar pregnancy, first trimester: Secondary | ICD-10-CM | POA: Diagnosis not present

## 2021-05-24 DIAGNOSIS — Z3481 Encounter for supervision of other normal pregnancy, first trimester: Secondary | ICD-10-CM

## 2021-05-24 LAB — POCT URINALYSIS DIPSTICK OB
Glucose, UA: NEGATIVE
POC,PROTEIN,UA: NEGATIVE

## 2021-05-24 NOTE — Progress Notes (Signed)
  Routine Prenatal Care Visit  Subjective  Cynthia Jackson is a 35 y.o. [phone] at [redacted]w[redacted]d being seen today for ongoing prenatal care.  She is currently monitored for the following issues for this low-risk pregnancy and has Supervision of other normal pregnancy, antepartum; Anxiety; and Carcinoma in situ of cervix on their problem list.  ----------------------------------------------------------------------------------- Patient reports no complaints.  She has questions regarding effect of methotrexate used in August for ectopic pregnancy on this fetus. We discussed dating scan today.  . Vag. Bleeding: None.   . Leaking Fluid denies.  ----------------------------------------------------------------------------------- The following portions of the patient's history were reviewed and updated as appropriate: allergies, current medications, past family history, past medical history, past social history, past surgical history and problem list. Problem list updated.  Objective  Blood pressure 100/60, weight 107 lb (48.5 kg), last menstrual period 04/15/2021 Pregravid weight 105 lb (47.6 kg) Total Weight Gain 2 lb (0.907 kg) Urinalysis: Urine Protein    Urine Glucose    Fetal Status: Fetal Heart Rate (bpm): 124          Dating: EDD adjusted for 9 day difference on ultrasound today  General:  Alert, oriented and cooperative. Patient is in no acute distress.  Skin: Skin is warm and dry. No rash noted.   Cardiovascular: Normal heart rate noted  Respiratory: Normal respiratory effort, no problems with respiration noted  Abdomen: Soft, gravid, appropriate for gestational age. Pain/Pressure: Absent     Pelvic:  Cervical exam deferred        Extremities: Normal range of motion.     Mental Status: Normal mood and affect. Normal behavior. Normal judgment and thought content.   Assessment   35 y.o. [id number] at [redacted]w[redacted]d by  01/11/2022, by Ultrasound presenting for routine prenatal visit  Plan   pregnancy 4  Problems (from 05/17/21 to present)    Problem Noted Resolved   Supervision of other normal pregnancy, antepartum 05/17/2021 by Ellwood Sayers, CNM No   Overview Signed 05/17/2021  4:51 PM by Ellwood Sayers, CNM     Nursing Staff Provider  Office Location  [facility] Dating  By 6 week ultrasound  Language  English  Anatomy US    Flu Vaccine   Genetic Screen  NIPS:   TDaP vaccine    Hgb A1C or  GTT Early : NA Third trimester :   Covid    LAB RESULTS   Rhogam   Blood Type     Feeding Plan breast Antibody    Contraception  Rubella    Circumcision  RPR     Pediatrician   HBsAg     Support Person Dustin  HIV    Prenatal Classes  Varicella     GBS  (For PCN allergy, check sensitivities)   BTL Consent     VBAC Consent  Pap      Hgb Electro    Pelvis Tested  CF      SMA                   Preterm labor symptoms and general obstetric precautions including but not limited to vaginal bleeding, contractions, leaking of fluid and fetal movement were reviewed in detail with the patient.    Return in about 4 weeks (around 06/21/2021) for rob.  Tresea Mall, CNM 05/24/2021 1:56 PM

## 2021-05-24 NOTE — Progress Notes (Signed)
Appointment cancelled, closed for Dr. Bonney Aid.

## 2021-06-14 ENCOUNTER — Encounter: Payer: Self-pay | Admitting: Obstetrics

## 2021-06-19 NOTE — L&D Delivery Note (Signed)
Date of delivery: 01/12/2022 Estimated Date of Delivery: 01/11/22 Patient's last menstrual period was 04/15/2021. EGA: [redacted]w[redacted]d  Delivery Note At 11:41 PM a viable female was delivered via Vaginal, Spontaneous Presentation:  OA with restitution to LOA/compound right hand   APGAR:  8, 9         Weight: 3270 g, 7 pounds 3 ounces Placenta status: Spontaneous, Intact.   Cord: 3 vessels with the following complications: None.  Cord pH: NA  Called to see patient.  Mom pushed well to deliver a viable female infant.  The head with compound right hand followed by shoulders, which delivered without difficulty, and the rest of the body.  No nuchal cord noted.  Baby to mom's chest.  Cord clamped and cut after 4 min delay.  Cord blood obtained.  Placenta delivered spontaneously, intact, with a 3-vessel cord.  All counts correct.  Hemostasis obtained with IV pitocin and fundal massage.      Anesthesia: Epidural Episiotomy:  none Lacerations:  none Suture Repair:  NA Est. Blood Loss (mL):  350  Mom to postpartum.  Baby to Couplet care / Skin to Skin.   Tresea Mall, CNM 01/13/2022, 12:43 AM

## 2021-06-22 ENCOUNTER — Other Ambulatory Visit: Payer: Self-pay

## 2021-06-22 ENCOUNTER — Ambulatory Visit (INDEPENDENT_AMBULATORY_CARE_PROVIDER_SITE_OTHER): Payer: Medicaid Other | Admitting: Obstetrics

## 2021-06-22 VITALS — BP 114/70 | Wt 114.0 lb

## 2021-06-22 DIAGNOSIS — Z1379 Encounter for other screening for genetic and chromosomal anomalies: Secondary | ICD-10-CM

## 2021-06-22 DIAGNOSIS — O99320 Drug use complicating pregnancy, unspecified trimester: Secondary | ICD-10-CM

## 2021-06-22 DIAGNOSIS — F1290 Cannabis use, unspecified, uncomplicated: Secondary | ICD-10-CM

## 2021-06-22 DIAGNOSIS — Z3481 Encounter for supervision of other normal pregnancy, first trimester: Secondary | ICD-10-CM

## 2021-06-22 DIAGNOSIS — Z3A11 11 weeks gestation of pregnancy: Secondary | ICD-10-CM

## 2021-06-22 DIAGNOSIS — Z3480 Encounter for supervision of other normal pregnancy, unspecified trimester: Secondary | ICD-10-CM

## 2021-06-22 HISTORY — DX: Drug use complicating pregnancy, unspecified trimester: O99.320

## 2021-06-22 NOTE — Progress Notes (Signed)
°  Routine Prenatal Care Visit  Subjective  Cynthia Jackson is a 36 y.o. [phone] at [redacted]w[redacted]d being seen today for ongoing prenatal care.  She is currently monitored for the following issues for this high-risk pregnancy and has Supervision of other normal pregnancy, antepartum; Anxiety; Carcinoma in situ of cervix; and Marijuana use during pregnancy on their problem list.  ----------------------------------------------------------------------------------- Patient reports no complaints.    .  .   Cynthia Jackson Fluid denies.  ----------------------------------------------------------------------------------- The following portions of the patient's history were reviewed and updated as appropriate: allergies, current medications, past family history, past medical history, past social history, past surgical history and problem list. Problem list updated.  Objective  Blood pressure 114/70, weight 114 lb (51.7 kg), last menstrual period 04/15/2021, unknown if currently breastfeeding. Pregravid weight 105 lb (47.6 kg) Total Weight Gain 9 lb (4.082 kg) Urinalysis: Urine Protein    Urine Glucose    Fetal Status:           General:  Alert, oriented and cooperative. Patient is in no acute distress.  Skin: Skin is warm and dry. No rash noted.   Cardiovascular: Normal heart rate noted  Respiratory: Normal respiratory effort, no problems with respiration noted  Abdomen: Soft, gravid, appropriate for gestational age.       Pelvic:  Cervical exam deferred        Extremities: Normal range of motion.  Edema: None  Mental Status: Normal mood and affect. Normal behavior. Normal judgment and thought content.   Assessment   36 y.o. [id number] at [redacted]w[redacted]d by  01/11/2022, by Ultrasound presenting for routine prenatal visit  Plan   pregnancy 4 Problems (from 05/17/21 to present)    Problem Noted Resolved   Supervision of other normal pregnancy, antepartum 05/17/2021 by Allen Derry, CNM No   Overview Addendum  06/22/2021  9:22 AM by Imagene Riches, CNM     Nursing Staff Provider  Office Location  [facility] Dating  By 6 week ultrasound  Language  English  Anatomy US    Flu Vaccine   Genetic Screen  NIPS:   TDaP vaccine    Hgb A1C or  GTT Early : NA Third trimester :   Covid    LAB RESULTS   Rhogam   Blood Type O/Positive/-- (11/29 1629)   Feeding Plan breast Antibody Negative (11/29 1629)  Contraception  Rubella 1.17 (11/29 1629)  Circumcision  RPR Non Reactive (11/29 1629)   Pediatrician   HBsAg immune  Support Person Dustin  HIV   Prenatal Classes  Varicella     GBS  (For PCN allergy, check sensitivities)   BTL Consent     VBAC Consent  Pap      Hgb Electro    Pelvis Tested  CF      SMA                   Preterm labor symptoms and general obstetric precautions including but not limited to vaginal bleeding, contractions, leaking of fluid and fetal movement were reviewed in detail with the patient. Please refer to After Visit Summary for other counseling recommendations. \ Initially had difficulty getting FHTs, but used the ultrasound machine, saw fetal movement and then got FHTS with doppler.  Return in about 4 weeks (around 07/20/2021) for return OB.  Imagene Riches, CNM  06/22/2021 9:43 AM

## 2021-06-22 NOTE — Progress Notes (Signed)
No vb. No lof.  

## 2021-06-26 LAB — MATERNIT 21 PLUS CORE, BLOOD
Fetal Fraction: 8
Result (T21): NEGATIVE
Trisomy 13 (Patau syndrome): NEGATIVE
Trisomy 18 (Edwards syndrome): NEGATIVE
Trisomy 21 (Down syndrome): NEGATIVE

## 2021-07-26 ENCOUNTER — Other Ambulatory Visit: Payer: Self-pay

## 2021-07-26 ENCOUNTER — Ambulatory Visit (INDEPENDENT_AMBULATORY_CARE_PROVIDER_SITE_OTHER): Payer: Medicaid Other | Admitting: Obstetrics

## 2021-07-26 VITALS — BP 102/60 | Wt 121.0 lb

## 2021-07-26 DIAGNOSIS — Z3480 Encounter for supervision of other normal pregnancy, unspecified trimester: Secondary | ICD-10-CM

## 2021-07-26 DIAGNOSIS — Z3A15 15 weeks gestation of pregnancy: Secondary | ICD-10-CM

## 2021-07-26 LAB — POCT URINALYSIS DIPSTICK OB
Glucose, UA: NEGATIVE
POC,PROTEIN,UA: NEGATIVE

## 2021-07-26 NOTE — Progress Notes (Signed)
Routine Prenatal Care Visit  Subjective  Cynthia Jackson is a 36 y.o. [phone] at [redacted]w[redacted]d being seen today for ongoing prenatal care.  She is currently monitored for the following issues for this low-risk pregnancy and has Supervision of other normal pregnancy, antepartum; Anxiety; Carcinoma in situ of cervix; and Marijuana use during pregnancy on their problem list.  ----------------------------------------------------------------------------------- Patient reports no complaints.  Sheis having a girl and has named her [place].She has a great appetite and is eating a lot. Contractions: Not present. Vag. Bleeding: None.   . Leaking Fluid denies.  ----------------------------------------------------------------------------------- The following portions of the patient's history were reviewed and updated as appropriate: allergies, current medications, past family history, past medical history, past social history, past surgical history and problem list. Problem list updated.  Objective  Blood pressure 102/60, weight 121 lb (54.9 kg), last menstrual period 04/15/2021, unknown if currently breastfeeding. Pregravid weight 105 lb (47.6 kg) Total Weight Gain 16 lb (7.258 kg) Urinalysis: Urine Protein    Urine Glucose    Fetal Status:           General:  Alert, oriented and cooperative. Patient is in no acute distress.  Skin: Skin is warm and dry. No rash noted.   Cardiovascular: Normal heart rate noted  Respiratory: Normal respiratory effort, no problems with respiration noted  Abdomen: Soft, gravid, appropriate for gestational age. Pain/Pressure: Absent     Pelvic:  Cervical exam deferred        Extremities: Normal range of motion.     Mental Status: Normal mood and affect. Normal behavior. Normal judgment and thought content.   Assessment   36 y.o. [id number] at [redacted]w[redacted]d by  01/11/2022, by Ultrasound presenting for routine prenatal visit  Plan   pregnancy 4 Problems (from 05/17/21 to present)     Problem Noted Resolved   Supervision of other normal pregnancy, antepartum 05/17/2021 by Ellwood Sayers, CNM No   Overview Addendum 07/26/2021  8:47 AM by Mirna Mires, CNM     Nursing Staff Provider  Office Location  [facility] Dating  By 6 week ultrasound  Language  English  Anatomy US    Flu Vaccine   Genetic Screen  NIPS: neg, xx  TDaP vaccine    Hgb A1C or  GTT Early : NA Third trimester :   Covid    LAB RESULTS   Rhogam   Blood Type O/Positive/-- (11/29 1629)   Feeding Plan breast Antibody Negative (11/29 1629)  Contraception  Rubella 1.17 (11/29 1629)  Circumcision  RPR Non Reactive (11/29 1629)   Pediatrician   HBsAg immune  Support Person Dustin  HIV   Prenatal Classes  Varicella     GBS  (For PCN allergy, check sensitivities)   BTL Consent     VBAC Consent  Pap      Hgb Electro    Pelvis Tested  CF      SMA                   Preterm labor symptoms and general obstetric precautions including but not limited to vaginal bleeding, contractions, leaking of fluid and fetal movement were reviewed in detail with the patient. Please refer to After Visit Summary for other counseling recommendations.  Anatomy scan order is placed for 4 weeks out.  Return in about 4 weeks (around 08/23/2021) for return OB.  Mirna Mires, CNM  07/26/2021 8:49 AM

## 2021-07-26 NOTE — Addendum Note (Signed)
Addended by: Liliane Shi on: 07/26/2021 09:31 AM   Modules accepted: Orders

## 2021-08-16 ENCOUNTER — Ambulatory Visit
Admission: RE | Admit: 2021-08-16 | Discharge: 2021-08-16 | Disposition: A | Discharge status: Home / Self Care | Payer: Medicaid Other | Source: Ambulatory Visit | Attending: Obstetrics | Admitting: Obstetrics

## 2021-08-16 ENCOUNTER — Other Ambulatory Visit: Payer: Self-pay

## 2021-08-16 DIAGNOSIS — Z3480 Encounter for supervision of other normal pregnancy, unspecified trimester: Secondary | ICD-10-CM

## 2021-08-16 DIAGNOSIS — Z3A18 18 weeks gestation of pregnancy: Secondary | ICD-10-CM | POA: Insufficient documentation

## 2021-08-16 DIAGNOSIS — Z3689 Encounter for other specified antenatal screening: Secondary | ICD-10-CM | POA: Insufficient documentation

## 2021-08-23 ENCOUNTER — Encounter: Payer: Self-pay | Admitting: Advanced Practice Midwife

## 2021-08-23 ENCOUNTER — Other Ambulatory Visit: Payer: Self-pay

## 2021-08-23 ENCOUNTER — Ambulatory Visit (INDEPENDENT_AMBULATORY_CARE_PROVIDER_SITE_OTHER): Payer: Medicaid Other | Admitting: Advanced Practice Midwife

## 2021-08-23 VITALS — BP 100/70 | Wt 128.0 lb

## 2021-08-23 DIAGNOSIS — Z3A19 19 weeks gestation of pregnancy: Secondary | ICD-10-CM

## 2021-08-23 DIAGNOSIS — Z3482 Encounter for supervision of other normal pregnancy, second trimester: Secondary | ICD-10-CM

## 2021-08-23 LAB — POCT URINALYSIS DIPSTICK OB
Glucose, UA: NEGATIVE
POC,PROTEIN,UA: NEGATIVE

## 2021-08-23 NOTE — Progress Notes (Signed)
Routine Prenatal Care Visit ? ?Subjective  ?Cynthia Jackson is a 36 y.o. [phone] at [redacted]w[redacted]d being seen today for ongoing prenatal care.  She is currently monitored for the following issues for this low-risk pregnancy and has Supervision of other normal pregnancy, antepartum; Anxiety; Carcinoma in situ of cervix; and Marijuana use during pregnancy on their problem list.  ?----------------------------------------------------------------------------------- ?Patient reports she is doing well. She has questions regarding her history of abnormal PAP smears and wonders if after delivery she can have surgery to remove "everything". She is mainly concerned about removing her cervix. We had a discussion regarding mode of delivery options and management of abnormal PAPs. She also plans to discuss surgical options with MD.   ?Contractions: Not present. Vag. Bleeding: None.  Movement: Present. Leaking Fluid denies.  ?----------------------------------------------------------------------------------- ?The following portions of the patient's history were reviewed and updated as appropriate: allergies, current medications, past family history, past medical history, past social history, past surgical history and problem list. Problem list updated. ? ?Objective  ?Blood pressure 100/70, weight 128 lb (58.1 kg), last menstrual period 04/15/2021, unknown if currently breastfeeding. ?Pregravid weight 105 lb (47.6 kg) Total Weight Gain 23 lb (10.4 kg) ?Urinalysis: Urine Protein Negative  Urine Glucose Negative ? ?Fetal Status: Fetal Heart Rate (bpm): 148 Fundal Height: 20 cm Movement: Present     ? ?Anatomy scan done 1 week ago: complete, normal, female, placenta posterior ? ?General:  Alert, oriented and cooperative. Patient is in no acute distress.  ?Skin: Skin is warm and dry. No rash noted.   ?Cardiovascular: Normal heart rate noted  ?Respiratory: Normal respiratory effort, no problems with respiration noted  ?Abdomen: Soft, gravid,  appropriate for gestational age. Pain/Pressure: Absent     ?Pelvic:  Cervical exam deferred        ?Extremities: Normal range of motion.  Edema: None  ?Mental Status: Normal mood and affect. Normal behavior. Normal judgment and thought content.  ? ?Assessment  ? ?36 y.o. [id number] at [redacted]w[redacted]d by  01/11/2022, by Ultrasound presenting for routine prenatal visit ? ?Plan  ? ?pregnancy 4 Problems (from 05/17/21 to present)   ? Problem Noted Resolved  ? Supervision of other normal pregnancy, antepartum 05/17/2021 by Ellwood Sayers, CNM No  ? Overview Addendum 08/17/2021  6:57 PM by Mirna Mires, CNM  ?   ?Nursing Staff Provider  ?Office Location  [facility] Dating  By 6 week ultrasound  ?Language  English  Anatomy US  Normal female  ?Flu Vaccine   Genetic Screen  NIPS: neg, xx  ?TDaP vaccine    Hgb A1C or  ?GTT Early : NA ?Third trimester :   ?Covid    LAB RESULTS   ?Rhogam   Blood Type O/Positive/-- (11/29 1629)   ?Feeding Plan breast Antibody Negative (11/29 1629)  ?Contraception  Rubella 1.17 (11/29 1629)  ?Circumcision  RPR Non Reactive (11/29 1629)   ?Pediatrician   HBsAg immune  ?Support Person Dustin  HIV   ?Prenatal Classes  Varicella   ?  GBS  (For PCN allergy, check sensitivities)   ?BTL Consent     ?VBAC Consent  Pap    ?  Hgb Electro    ?Pelvis Tested  CF   ?   SMA   ?     ? ? ?  ?  ?  ?  ? ?Preterm labor symptoms and general obstetric precautions including but not limited to vaginal bleeding, contractions, leaking of fluid and fetal movement were reviewed in detail with the  patient. ?Please refer to After Visit Summary for other counseling recommendations.  ? ?Return in about 4 weeks (around 09/20/2021) for rob. ? ?Tresea Mall, CNM ?08/23/2021 8:35 AM   ? ?

## 2021-08-23 NOTE — Patient Instructions (Signed)
Pap Test °Why am I having this test? °A Pap test, also called a Pap smear, is a screening test to check for signs of: °Infection. °Cancer of the cervix. The cervix is the lower part of the uterus that opens into the vagina. °Changes that may be a sign that cancer is developing (precancerous changes). °Women need this test on a regular basis. In general, you should have a Pap test every 3 years until you reach menopause or age 34. Women aged 30-60 may choose to have their Pap test done at the same time as an HPV (human papillomavirus) test every 5 years (instead of every 3 years). °Your health care provider may recommend having Pap tests more or less often depending on your medical conditions and past Pap test results. °What is being tested? °Cervical cells are tested for signs of infection or abnormalities. °What kind of sample is taken? °Your health care provider will collect a sample of cells from the surface of your cervix. This will be done using a small cotton swab, plastic spatula, or brush that is inserted into your vagina using a tool called a speculum. This sample is often collected during a pelvic exam, when you are lying on your back on an exam table with your feet in footrests (stirrups). In some cases, fluids (secretions) from the cervix or vagina may also be collected. °How do I prepare for this test? °Be aware of where you are in your menstrual cycle. If you are menstruating on the day of the test, you may be asked to reschedule. °You may need to reschedule if you have a known vaginal infection on the day of the test. °Follow instructions from your health care provider about: °Changing or stopping your regular medicines. Some medicines can cause abnormal test results, such as vaginal medicines and tetracycline. °Avoiding douching 2-3 days before or the day of the test. °Tell a health care provider about: °Any allergies you have. °All medicines you are taking, including vitamins, herbs, eye drops,  creams, and over-the-counter medicines. °Any bleeding problems you have. °Any surgeries you have had. °Any medical conditions you have. °Whether you are pregnant or may be pregnant. °How are the results reported? °Your test results will be reported as either abnormal or normal. °What do the results mean? °A normal test result means that you do not have signs of cancer of the cervix. °An abnormal result may mean that you have: °Cancer. A Pap test by itself is not enough to diagnose cancer. You will have more tests done if cancer is suspected. °Precancerous changes in your cervix. °Inflammation of the cervix. °An STI (sexually transmitted infection). °A fungal infection. °A parasite infection. °Talk with your health care provider about what your results mean. In some cases, your health care provider may do more testing to confirm the results. °Questions to ask your health care provider °Ask your health care provider, or the department that is doing the test: °When will my results be ready? °How will I get my results? °What are my treatment options? °What other tests do I need? °What are my next steps? °Summary °In general, women should have a Pap test every 3 years until they reach menopause or age 34. °Your health care provider will collect a sample of cells from the surface of your cervix. This will be done using a small cotton swab, plastic spatula, or brush. °In some cases, fluids (secretions) from the cervix or vagina may also be collected. °This information is not intended   to replace advice given to you by your health care provider. Make sure you discuss any questions you have with your health care provider. °Document Revised: 09/03/2020 Document Reviewed: 09/03/2020 °Elsevier Patient Education © 2022 [employer] Inc. ° °

## 2021-09-20 ENCOUNTER — Ambulatory Visit (INDEPENDENT_AMBULATORY_CARE_PROVIDER_SITE_OTHER): Payer: Medicaid Other | Admitting: Family Medicine

## 2021-09-20 VITALS — BP 90/64 | Wt 137.0 lb

## 2021-09-20 DIAGNOSIS — K029 Dental caries, unspecified: Secondary | ICD-10-CM

## 2021-09-20 DIAGNOSIS — Z3480 Encounter for supervision of other normal pregnancy, unspecified trimester: Secondary | ICD-10-CM

## 2021-09-20 DIAGNOSIS — F1290 Cannabis use, unspecified, uncomplicated: Secondary | ICD-10-CM

## 2021-09-20 DIAGNOSIS — F419 Anxiety disorder, unspecified: Secondary | ICD-10-CM

## 2021-09-20 DIAGNOSIS — O99320 Drug use complicating pregnancy, unspecified trimester: Secondary | ICD-10-CM

## 2021-09-20 LAB — POCT URINALYSIS DIPSTICK OB
Glucose, UA: NEGATIVE
POC,PROTEIN,UA: NEGATIVE

## 2021-09-20 NOTE — Patient Instructions (Signed)
Here is the website for the [facility] to have your partner contact them about Vasectomy ? ?[email or web address] ?

## 2021-09-20 NOTE — Progress Notes (Signed)
ROB- no concerns 

## 2021-09-20 NOTE — Progress Notes (Signed)
? ?  PRENATAL VISIT NOTE ? ?Subjective:  ?Cynthia Jackson is a 36 y.o. [phone] at [redacted]w[redacted]d being seen today for ongoing prenatal care.  She is currently monitored for the following issues for this low-risk pregnancy and has Supervision of other normal pregnancy, antepartum; Anxiety; Carcinoma in situ of cervix; Marijuana use during pregnancy; and Dental caries on their problem list. ? ?Patient reports no complaints.  Contractions: Not present. Vag. Bleeding: None.  Movement: Present. Denies leaking of fluid.  ? ?The following portions of the patient's history were reviewed and updated as appropriate: allergies, current medications, past family history, past medical history, past social history, past surgical history and problem list.  ? ?Objective:  ? ?Vitals:  ? 09/20/21 0923  ?BP: 90/64  ?Weight: 137 lb (62.1 kg)  ? ? ?Fetal Status: Fetal Heart Rate (bpm): 145 Fundal Height: 23 cm Movement: Present    ? ?General:  Alert, oriented and cooperative. Patient is in no acute distress.  ?Skin: Skin is warm and dry. No rash noted.   ?Cardiovascular: Normal heart rate noted  ?Respiratory: Normal respiratory effort, no problems with respiration noted  ?Abdomen: Soft, gravid, appropriate for gestational age.  Pain/Pressure: Absent     ?Pelvic: Cervical exam deferred        ?Extremities: Normal range of motion.  Edema: None  ?Mental Status: Normal mood and affect. Normal behavior. Normal judgment and thought content.  ? ?Assessment and Plan:  ?Pregnancy: [id number] at [redacted]w[redacted]d ?1. Marijuana use during pregnancy ?Continue to encourage cessation ? ?2. Supervision of other normal pregnancy, antepartum ?Has poor dentition, discussed getting pregnancy medicaid and having dental work in pregnancy ?Given Medicaid verification letter today ?Reviewed signing BTS next visit, also discussed vasectomy. [facility] urology website via [facility]  ?28 wk labs next visit ? ?3. Anxiety ?Stable today ? ?Preterm labor symptoms and general obstetric  precautions including but not limited to vaginal bleeding, contractions, leaking of fluid and fetal movement were reviewed in detail with the patient. ?Please refer to After Visit Summary for other counseling recommendations.  ? ?Return in about 4 weeks (around 10/18/2021) for Routine prenatal care, 28 wk labs. ? ?Future Appointments  ?Date Time Provider Department Center  ?10/18/2021  8:20 AM [facility] None  ?10/18/2021  8:55 AM Dominic, Courtney Heys, CNM [facility] None  ? ? ?Federico Flake, MD ? ?

## 2021-10-18 ENCOUNTER — Ambulatory Visit (INDEPENDENT_AMBULATORY_CARE_PROVIDER_SITE_OTHER): Payer: Medicaid Other | Admitting: Family Medicine

## 2021-10-18 ENCOUNTER — Encounter: Payer: Medicaid Other | Admitting: Licensed Practical Nurse

## 2021-10-18 ENCOUNTER — Encounter: Payer: Self-pay | Admitting: Family Medicine

## 2021-10-18 ENCOUNTER — Other Ambulatory Visit: Payer: Medicaid Other

## 2021-10-18 VITALS — BP 120/80 | Wt 135.0 lb

## 2021-10-18 DIAGNOSIS — F1290 Cannabis use, unspecified, uncomplicated: Secondary | ICD-10-CM

## 2021-10-18 DIAGNOSIS — O99320 Drug use complicating pregnancy, unspecified trimester: Secondary | ICD-10-CM

## 2021-10-18 DIAGNOSIS — D060 Carcinoma in situ of endocervix: Secondary | ICD-10-CM

## 2021-10-18 DIAGNOSIS — K029 Dental caries, unspecified: Secondary | ICD-10-CM

## 2021-10-18 DIAGNOSIS — Z3480 Encounter for supervision of other normal pregnancy, unspecified trimester: Secondary | ICD-10-CM

## 2021-10-18 NOTE — Progress Notes (Signed)
? ?  PRENATAL VISIT NOTE ? ?Subjective:  ?Cynthia Jackson is a 36 y.o. [phone] at [redacted]w[redacted]d being seen today for ongoing prenatal care.  She is currently monitored for the following issues for this low-risk pregnancy and has Supervision of other normal pregnancy, antepartum; Anxiety; Carcinoma in situ of cervix; Marijuana use during pregnancy; and Dental caries on their problem list. ? ?Patient reports  pulling in lower abdomen .  Contractions: Not present. Vag. Bleeding: None.  Movement: Present. Denies leaking of fluid.  ? ?The following portions of the patient's history were reviewed and updated as appropriate: allergies, current medications, past family history, past medical history, past social history, past surgical history and problem list.  ? ?Objective:  ? ?Vitals:  ? 10/18/21 0817  ?BP: 120/80  ?Weight: 135 lb (61.2 kg)  ? ? ?Fetal Status: Fetal Heart Rate (bpm): 145 Fundal Height: 28 cm Movement: Present    ? ?General:  Alert, oriented and cooperative. Patient is in no acute distress.  ?Skin: Skin is warm and dry. No rash noted.   ?Cardiovascular: Normal heart rate noted  ?Respiratory: Normal respiratory effort, no problems with respiration noted  ?Abdomen: Soft, gravid, appropriate for gestational age.  Pain/Pressure: Present     ?Pelvic: Cervical exam deferred        ?Extremities: Normal range of motion.  Edema: None  ?Mental Status: Normal mood and affect. Normal behavior. Normal judgment and thought content.  ? ?Assessment and Plan:  ?Pregnancy: [id number] at [redacted]w[redacted]d ?1. Supervision of other normal pregnancy, antepartum ?Round ligament pain ?- CBC ?- RPR ?- HIV Antibody (routine testing w rflx) ?- Glucose, 1 hour ? ?2. Dental caries ? ?3. Marijuana use during pregnancy ? ?4. 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 ? ?Preterm labor symptoms and general obstetric precautions including but not limited to vaginal bleeding, contractions,  leaking of fluid and fetal movement were reviewed in detail with the patient. ?Please refer to After Visit Summary for other counseling recommendations.  ? ?Return in about 2 weeks (around 11/01/2021) for Routine prenatal care. ? ?Future Appointments  ?Date Time Provider [place]  ?11/01/2021 10:15 AM Rod Can, CNM [facility] None  ?11/15/2021  8:15 AM Dominic, Nunzio Cobbs, CNM [facility] None  ?11/29/2021  8:15 AM Imagene Riches, CNM [facility] None  ? ? ?Caren Macadam, MD ?

## 2021-10-19 LAB — CBC
Hematocrit: 36.2 % (ref 34.0–46.6)
Hemoglobin: 12.4 g/dL (ref 11.1–15.9)
MCH: 32 pg (ref 26.6–33.0)
MCHC: 34.3 g/dL (ref 31.5–35.7)
MCV: 93 fL (ref 79–97)
Platelets: 185 10*3/uL (ref 150–450)
RBC: 3.88 x10E6/uL (ref 3.77–5.28)
RDW: 12.9 % (ref 11.7–15.4)
WBC: 8.3 10*3/uL (ref 3.4–10.8)

## 2021-10-19 LAB — HIV ANTIBODY (ROUTINE TESTING W REFLEX): HIV Screen 4th Generation wRfx: NONREACTIVE

## 2021-10-19 LAB — RPR: RPR Ser Ql: NONREACTIVE

## 2021-10-19 LAB — GLUCOSE, 1 HOUR GESTATIONAL: Gestational Diabetes Screen: 82 mg/dL (ref 70–139)

## 2021-11-01 ENCOUNTER — Ambulatory Visit (INDEPENDENT_AMBULATORY_CARE_PROVIDER_SITE_OTHER): Payer: Medicaid Other | Admitting: Advanced Practice Midwife

## 2021-11-01 ENCOUNTER — Encounter: Payer: Self-pay | Admitting: Advanced Practice Midwife

## 2021-11-01 VITALS — BP 120/80 | Wt 140.0 lb

## 2021-11-01 DIAGNOSIS — Z3480 Encounter for supervision of other normal pregnancy, unspecified trimester: Secondary | ICD-10-CM

## 2021-11-01 DIAGNOSIS — Z3A29 29 weeks gestation of pregnancy: Secondary | ICD-10-CM

## 2021-11-01 LAB — POCT URINALYSIS DIPSTICK OB
Glucose, UA: NEGATIVE
POC,PROTEIN,UA: NEGATIVE

## 2021-11-01 NOTE — Progress Notes (Signed)
Routine Prenatal Care Visit ? ?Subjective  ?Cynthia Jackson is a 36 y.o. [phone] at [redacted]w[redacted]d being seen today for ongoing prenatal care.  She is currently monitored for the following issues for this low-risk pregnancy and has Supervision of other normal pregnancy, antepartum; Anxiety; Carcinoma in situ of cervix; Marijuana use during pregnancy; and Dental caries on their problem list.  ?----------------------------------------------------------------------------------- ?Patient reports occasional brief rapid fetal movements and good fetal movement overall.   ?Contractions: Not present. Vag. Bleeding: None.  Movement: Present. Leaking Fluid denies.  ?----------------------------------------------------------------------------------- ?The following portions of the patient's history were reviewed and updated as appropriate: allergies, current medications, past family history, past medical history, past social history, past surgical history and problem list. Problem list updated. ? ?Objective  ?Blood pressure 120/80, weight 140 lb (63.5 kg), last menstrual period 04/15/2021, unknown if currently breastfeeding. ?Pregravid weight 105 lb (47.6 kg) Total Weight Gain 35 lb (15.9 kg) ?Urinalysis: Urine Protein Negative  Urine Glucose Negative ? ?Fetal Status: Fetal Heart Rate (bpm): 142 Fundal Height: 30 cm Movement: Present    ? ?General:  Alert, oriented and cooperative. Patient is in no acute distress.  ?Skin: Skin is warm and dry. No rash noted.   ?Cardiovascular: Normal heart rate noted  ?Respiratory: Normal respiratory effort, no problems with respiration noted  ?Abdomen: Soft, gravid, appropriate for gestational age. Pain/Pressure: Absent     ?Pelvic:  Cervical exam deferred        ?Extremities: Normal range of motion.  Edema: None  ?Mental Status: Normal mood and affect. Normal behavior. Normal judgment and thought content.  ? ?Assessment  ? ?36 y.o. [id number] at [redacted]w[redacted]d by  01/11/2022, by Ultrasound presenting for routine  prenatal visit ? ?Plan  ? ?pregnancy 4 Problems (from 05/17/21 to present)   ? Problem Noted Resolved  ? Supervision of other normal pregnancy, antepartum 05/17/2021 by Ellwood Sayers, CNM No  ? Overview Addendum 10/18/2021  2:57 PM by Federico Flake, MD  ?   ?Nursing Staff Provider  ?Office Location  [facility] Dating  By 6 week ultrasound  ?Language  English  Anatomy US  Normal female  ?Flu Vaccine   Genetic Screen  NIPS: neg, xx  ?TDaP vaccine    Hgb A1C or  ?GTT Early : NA ?Third trimester :   ?Covid    LAB RESULTS   ?Rhogam  na Blood Type O/Positive/-- (11/29 1629)   ?Feeding Plan breast Antibody Negative (11/29 1629)  ?Contraception BTS Rubella 1.17 (11/29 1629)  ?Circumcision  RPR Non Reactive (11/29 1629)   ?Pediatrician  [facility] vs [facility] HBsAg immune  ?Support Person Dustin  HIV   ?Prenatal Classes  Varicella   ?  GBS  (For PCN allergy, check sensitivities)   ?BTL Consent Signed 10/18/21    ?VBAC Consent  Pap    ?  Hgb Electro    ?Pelvis Tested  CF   ?   SMA   ?     ? ? ?  ?  ?  ?  ? ?Preterm labor symptoms and general obstetric precautions including but not limited to vaginal bleeding, contractions, leaking of fluid and fetal movement were reviewed in detail with the patient. ?Please refer to After Visit Summary for other counseling recommendations.  ? ?Return in about 2 weeks (around 11/15/2021) for rob. ? ?Tresea Mall, CNM ?11/01/2021 10:59 AM   ? ?

## 2021-11-15 ENCOUNTER — Encounter: Payer: Self-pay | Admitting: Licensed Practical Nurse

## 2021-11-15 ENCOUNTER — Ambulatory Visit (INDEPENDENT_AMBULATORY_CARE_PROVIDER_SITE_OTHER): Payer: Medicaid Other | Admitting: Licensed Practical Nurse

## 2021-11-15 VITALS — BP 122/60 | Wt 144.0 lb

## 2021-11-15 DIAGNOSIS — Z3480 Encounter for supervision of other normal pregnancy, unspecified trimester: Secondary | ICD-10-CM

## 2021-11-15 DIAGNOSIS — Z23 Encounter for immunization: Secondary | ICD-10-CM | POA: Diagnosis not present

## 2021-11-15 DIAGNOSIS — Z3A31 31 weeks gestation of pregnancy: Secondary | ICD-10-CM | POA: Diagnosis not present

## 2021-11-15 LAB — POCT URINALYSIS DIPSTICK OB
Glucose, UA: NEGATIVE
POC,PROTEIN,UA: NEGATIVE

## 2021-11-15 NOTE — Progress Notes (Signed)
Routine Prenatal Care Visit  Subjective  Cynthia Jackson is a 36 y.o. [phone] at [redacted]w[redacted]d being seen today for ongoing prenatal care.  She is currently monitored for the following issues for this low-risk pregnancy and has Supervision of other normal pregnancy, antepartum; Anxiety; Carcinoma in situ of cervix; Marijuana use during pregnancy; and Dental caries on their problem list.  ----------------------------------------------------------------------------------- Patient reports Here with her youngest Cynthia Jackson, Doing well, Gets occasional tightening in her abdomen, sleep is disturbed some with needing to get up to use the BR. Has a some worried in regards to labor pain, plans an epidural.  Desires BTL, aware it will most likely happen 4-6 wks PP.  Continues to smoke cigarettes, but "cutting down:. Contractions: Not present. Vag. Bleeding: None.  Movement: Present. Leaking Fluid denies.  ----------------------------------------------------------------------------------- The following portions of the patient's history were reviewed and updated as appropriate: allergies, current medications, past family history, past medical history, past social history, past surgical history and problem list. Problem list updated.  Objective  Blood pressure 122/60, weight 144 lb (65.3 kg), last menstrual period 04/15/2021, unknown if currently breastfeeding. Pregravid weight 105 lb (47.6 kg) Total Weight Gain 39 lb (17.7 kg) Urinalysis: Urine Protein Negative  Urine Glucose Negative  Fetal Status:     Movement: Present     General:  Alert, oriented and cooperative. Patient is in no acute distress.  Skin: Skin is warm and dry. No rash noted.   Cardiovascular: Normal heart rate noted  Respiratory: Normal respiratory effort, no problems with respiration noted  Abdomen: Soft, gravid, appropriate for gestational age. Pain/Pressure: Absent     Pelvic:  Cervical exam deferred        Extremities: Normal range of  motion.     Mental Status: Normal mood and affect. Normal behavior. Normal judgment and thought content.   Assessment   36 y.o. [id number] at [redacted]w[redacted]d by  01/11/2022, by Ultrasound presenting for routine prenatal visit  Plan   pregnancy 4 Problems (from 05/17/21 to present)     Problem Noted Resolved   Supervision of other normal pregnancy, antepartum 05/17/2021 by Ellwood Sayers, CNM No   Overview Addendum 11/15/2021  8:24 AM by Ellwood Sayers, CNM     Nursing Staff Provider  Office Location  [facility] Dating  By 6 week ultrasound  Language  English  Anatomy US  Normal female  Flu Vaccine   Genetic Screen  NIPS: neg, xx  TDaP vaccine   11/15/21 Hgb A1C or  GTT Early : NA Third trimester :   Covid    LAB RESULTS   Rhogam  na Blood Type O/Positive/-- (11/29 1629)   Feeding Plan breast Antibody Negative (11/29 1629)  Contraception BTS Rubella 1.17 (11/29 1629)  Circumcision NA RPR Non Reactive (05/02 0929)   Pediatrician  [facility] vs [facility] HBsAg immune  Support Person Dustin  HIV Non reactive  Prenatal Classes  Varicella Immune    GBS  (For PCN allergy, check sensitivities)   BTL Consent Signed 10/18/21    VBAC Consent  Pap      Hgb Electro    Pelvis Tested  CF      SMA                    Preterm labor symptoms and general obstetric precautions including but not limited to vaginal bleeding, contractions, leaking of fluid and fetal movement were reviewed in detail with the patient. Please refer to After Visit Summary for other  counseling recommendations.   Return in about 2 weeks (around 11/29/2021) for ROB.  TDAP given today   Carie Caddy, CNM  Domingo Pulse, [facility]  11/15/21  8:41 AM

## 2021-11-15 NOTE — Addendum Note (Signed)
Addended by: Burtis Junes on: 11/15/2021 08:49 AM   Modules accepted: Orders

## 2021-11-28 ENCOUNTER — Ambulatory Visit (INDEPENDENT_AMBULATORY_CARE_PROVIDER_SITE_OTHER): Payer: Medicaid Other | Admitting: Obstetrics

## 2021-11-28 VITALS — BP 120/74 | Wt 149.0 lb

## 2021-11-28 DIAGNOSIS — Z3480 Encounter for supervision of other normal pregnancy, unspecified trimester: Secondary | ICD-10-CM

## 2021-11-28 DIAGNOSIS — Z3A33 33 weeks gestation of pregnancy: Secondary | ICD-10-CM

## 2021-11-28 NOTE — Progress Notes (Signed)
No vb. No lof.  

## 2021-11-28 NOTE — Progress Notes (Signed)
Routine Prenatal Care Visit  Subjective  CLARIVEL CALLAWAY is a 36 y.o. [phone] at [redacted]w[redacted]d being seen today for ongoing prenatal care.  She is currently monitored for the following issues for this low-risk pregnancy and has Supervision of other normal pregnancy, antepartum; Anxiety; Carcinoma in situ of cervix; Marijuana use during pregnancy; and Dental caries on their problem list.  ----------------------------------------------------------------------------------- Patient reports no complaints.  She does feel more pressure with this pregnancy and wakes up during the night. Contractions: Not present. Vag. Bleeding: None.  Movement: Present. Leaking Fluid denies.  ----------------------------------------------------------------------------------- The following portions of the patient's history were reviewed and updated as appropriate: allergies, current medications, past family history, past medical history, past social history, past surgical history and problem list. Problem list updated.  Objective  Blood pressure 120/74, weight 149 lb (67.6 kg), last menstrual period 04/15/2021, unknown if currently breastfeeding. Pregravid weight 105 lb (47.6 kg) Total Weight Gain 44 lb (20 kg) Urinalysis: Urine Protein    Urine Glucose    Fetal Status:     Movement: Present     General:  Alert, oriented and cooperative. Patient is in no acute distress.  Skin: Skin is warm and dry. No rash noted.   Cardiovascular: Normal heart rate noted  Respiratory: Normal respiratory effort, no problems with respiration noted  Abdomen: Soft, gravid, appropriate for gestational age. Pain/Pressure: Absent     Pelvic:  Cervical exam deferred        Extremities: Normal range of motion.     Mental Status: Normal mood and affect. Normal behavior. Normal judgment and thought content.   Assessment   36 y.o. [id number] at [redacted]w[redacted]d by  01/11/2022, by Ultrasound presenting for routine prenatal visit  Plan   pregnancy 4 Problems  (from 05/17/21 to present)    Problem Noted Resolved   Supervision of other normal pregnancy, antepartum 05/17/2021 by Ellwood Sayers, CNM No   Overview Addendum 11/15/2021  8:24 AM by Ellwood Sayers, CNM     Nursing Staff Provider  Office Location  [facility] Dating  By 6 week ultrasound  Language  English  Anatomy US  Normal female  Flu Vaccine   Genetic Screen  NIPS: neg, xx  TDaP vaccine   11/15/21 Hgb A1C or  GTT Early : NA Third trimester :   Covid    LAB RESULTS   Rhogam  na Blood Type O/Positive/-- (11/29 1629)   Feeding Plan breast Antibody Negative (11/29 1629)  Contraception BTS Rubella 1.17 (11/29 1629)  Circumcision NA RPR Non Reactive (05/02 0929)   Pediatrician  [facility] vs [facility] HBsAg immune  Support Person Dustin  HIV Non reactive  Prenatal Classes  Varicella Immune    GBS  (For PCN allergy, check sensitivities)   BTL Consent Signed 10/18/21    VBAC Consent  Pap      Hgb Electro    Pelvis Tested  CF      SMA                   Preterm labor symptoms and general obstetric precautions including but not limited to vaginal bleeding, contractions, leaking of fluid and fetal movement were reviewed in detail with the patient. Please refer to After Visit Summary for other counseling recommendations.  Discussed sleep hygiene measures. Explained the GBS testing next visit.  Return in about 2 weeks (around 12/12/2021) for return OB, GBS, .  Mirna Mires, CNM  11/28/2021 8:27 AM

## 2021-11-29 ENCOUNTER — Encounter: Payer: Medicaid Other | Admitting: Obstetrics

## 2021-12-12 ENCOUNTER — Ambulatory Visit (INDEPENDENT_AMBULATORY_CARE_PROVIDER_SITE_OTHER): Payer: Medicaid Other | Admitting: Family Medicine

## 2021-12-12 ENCOUNTER — Encounter: Payer: Self-pay | Admitting: Family Medicine

## 2021-12-12 VITALS — BP 122/60 | Wt 150.0 lb

## 2021-12-12 DIAGNOSIS — Z3A35 35 weeks gestation of pregnancy: Secondary | ICD-10-CM

## 2021-12-12 DIAGNOSIS — F1290 Cannabis use, unspecified, uncomplicated: Secondary | ICD-10-CM

## 2021-12-12 DIAGNOSIS — Z3480 Encounter for supervision of other normal pregnancy, unspecified trimester: Secondary | ICD-10-CM

## 2021-12-12 DIAGNOSIS — K029 Dental caries, unspecified: Secondary | ICD-10-CM

## 2021-12-12 DIAGNOSIS — O99320 Drug use complicating pregnancy, unspecified trimester: Secondary | ICD-10-CM

## 2021-12-12 LAB — POCT URINALYSIS DIPSTICK OB
Glucose, UA: NEGATIVE
POC,PROTEIN,UA: NEGATIVE

## 2021-12-21 ENCOUNTER — Ambulatory Visit (INDEPENDENT_AMBULATORY_CARE_PROVIDER_SITE_OTHER): Payer: Medicaid Other | Admitting: Obstetrics & Gynecology

## 2021-12-21 ENCOUNTER — Other Ambulatory Visit (HOSPITAL_COMMUNITY)
Admission: RE | Admit: 2021-12-21 | Discharge: 2021-12-21 | Disposition: A | Discharge status: Home / Self Care | Payer: Medicaid Other | Source: Ambulatory Visit | Attending: Obstetrics & Gynecology | Admitting: Obstetrics & Gynecology

## 2021-12-21 VITALS — BP 124/60 | Wt 153.4 lb

## 2021-12-21 DIAGNOSIS — F1290 Cannabis use, unspecified, uncomplicated: Secondary | ICD-10-CM

## 2021-12-21 DIAGNOSIS — Z9889 Other specified postprocedural states: Secondary | ICD-10-CM | POA: Insufficient documentation

## 2021-12-21 DIAGNOSIS — K029 Dental caries, unspecified: Secondary | ICD-10-CM

## 2021-12-21 DIAGNOSIS — O99320 Drug use complicating pregnancy, unspecified trimester: Secondary | ICD-10-CM

## 2021-12-21 DIAGNOSIS — Z3A37 37 weeks gestation of pregnancy: Secondary | ICD-10-CM

## 2021-12-21 DIAGNOSIS — R87613 High grade squamous intraepithelial lesion on cytologic smear of cervix (HGSIL): Secondary | ICD-10-CM

## 2021-12-21 DIAGNOSIS — O3443 Maternal care for other abnormalities of cervix, third trimester: Secondary | ICD-10-CM

## 2021-12-21 LAB — POCT URINALYSIS DIPSTICK OB
Glucose, UA: NEGATIVE
POC,PROTEIN,UA: NEGATIVE

## 2021-12-21 NOTE — Progress Notes (Signed)
Subjective:    Cynthia Jackson is a 36 y.o. female being seen today for her obstetrical visit. She is at 101w0d gestation. Patient reports no complaints. Fetal movement: normal. She denies bleeding, ROM or contractions.   Review of Systems:   Review of Systems   Objective:    BP 124/60   Wt 153 lb 6.4 oz (69.6 kg)   LMP 04/15/2021   BMI 28.98 kg/m   Physical Exam  Exam  Well nourished, well hydrated White female, no apparent distress She is ambulating and conversing normally  FHT:  123 BPM  Uterine Size: 37 cm  Presentation: cephalic     Assessment:    Pregnancy:  [id number]    Plan:    Patient Active Problem List   Diagnosis Date Noted  . Dental caries 09/20/2021  . Marijuana use during pregnancy 06/22/2021  . Supervision of other normal pregnancy, antepartum 05/17/2021  . Anxiety 09/05/2019  . Carcinoma in situ of cervix 06/09/2016    1) 37 weeks pregnancy- GBS collected      Labor precautions reviewd  2) diagnosis of "CIS" on her chart but no pathology report to support this.       She will sign ROI for health dept where she had LEEP done about 6 years ago I checked a pap today due to her h/o ECC in 01/2021 that showed a small bit of atypical cells  3) unwanted fertility- She plans for a BTL and has signed the Medicaid forms already

## 2021-12-21 NOTE — Progress Notes (Signed)
Patient reports cramping and braxton hicks like contractions.

## 2021-12-23 LAB — CYTOLOGY - PAP
Adequacy: ABSENT
Comment: NEGATIVE
Diagnosis: NEGATIVE
High risk HPV: NEGATIVE

## 2021-12-25 LAB — CULTURE, BETA STREP (GROUP B ONLY): Strep Gp B Culture: NEGATIVE

## 2021-12-26 ENCOUNTER — Ambulatory Visit (INDEPENDENT_AMBULATORY_CARE_PROVIDER_SITE_OTHER): Payer: Medicaid Other | Admitting: Obstetrics & Gynecology

## 2021-12-26 VITALS — BP 100/70 | Wt 154.0 lb

## 2021-12-26 DIAGNOSIS — Z3480 Encounter for supervision of other normal pregnancy, unspecified trimester: Secondary | ICD-10-CM

## 2021-12-26 DIAGNOSIS — Z3A37 37 weeks gestation of pregnancy: Secondary | ICD-10-CM

## 2021-12-26 LAB — POCT URINALYSIS DIPSTICK OB
Glucose, UA: NEGATIVE
POC,PROTEIN,UA: NEGATIVE

## 2021-12-26 NOTE — Addendum Note (Signed)
Addended by: Donnetta Hail on: 12/26/2021 11:09 AM   Modules accepted: Orders

## 2021-12-26 NOTE — Progress Notes (Signed)
Subjective:    Cynthia Jackson is a 36 y.o. female being seen today for her obstetrical visit. She is at [redacted]w[redacted]d gestation. Patient reports no complaints. Fetal movement: normal.  Review of Systems:   Review of Systems   Objective:    BP 100/70   Wt 154 lb (69.9 kg)   LMP 04/15/2021   BMI 29.10 kg/m   Physical Exam  Exam  FHT:  120s BPM  Uterine Size: 37 cm  Presentation: cephalic     Assessment:    Pregnancy:  [id number]    Plan:      Doing well - Routine care Labor precautions reviewed. She reports that she has already scheduled her weekly ROB visits.

## 2022-01-02 ENCOUNTER — Ambulatory Visit (INDEPENDENT_AMBULATORY_CARE_PROVIDER_SITE_OTHER): Payer: Medicaid Other | Admitting: Obstetrics & Gynecology

## 2022-01-02 VITALS — BP 112/60 | Wt 156.2 lb

## 2022-01-02 DIAGNOSIS — Z3A38 38 weeks gestation of pregnancy: Secondary | ICD-10-CM

## 2022-01-02 LAB — POCT URINALYSIS DIPSTICK OB
Glucose, UA: NEGATIVE
POC,PROTEIN,UA: NEGATIVE

## 2022-01-02 NOTE — Progress Notes (Signed)
Subjective:    Cynthia Jackson is a 36 y.o. G4P2A1 being seen today for her obstetrical visit. She is at [redacted]w[redacted]d gestation. Patient reports no complaints. Fetal movement: normal. She would like a cervical exam today.  Review of Systems:   Review of Systems  Her last pap was normal 2023. 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   Objective:    BP 112/60   Wt 156 lb 3.2 oz (70.9 kg)   LMP 04/15/2021   BMI 29.51 kg/m   Physical Exam  Exam    FHT:  130s BPM  Uterine Size: 36 cm  Presentation: cephalic, The presenting part was not engaged so I used the bedside ultrasound to confirm vertex lie.     Assessment:    Pregnancy:  [id number]    Plan:    Patient Active Problem List   Diagnosis Date Noted  . Dental caries 09/20/2021  . Marijuana use during pregnancy 06/22/2021  . Supervision of other normal pregnancy, antepartum 05/17/2021  . Anxiety 09/05/2019  . Carcinoma in situ of cervix 06/09/2016    Labor precautions reviewed. Come back next week/prn sooner.

## 2022-01-09 ENCOUNTER — Encounter: Payer: Self-pay | Admitting: Licensed Practical Nurse

## 2022-01-09 ENCOUNTER — Ambulatory Visit (INDEPENDENT_AMBULATORY_CARE_PROVIDER_SITE_OTHER): Payer: Medicaid Other | Admitting: Licensed Practical Nurse

## 2022-01-09 VITALS — BP 106/58 | Wt 159.4 lb

## 2022-01-09 DIAGNOSIS — Z3A39 39 weeks gestation of pregnancy: Secondary | ICD-10-CM

## 2022-01-09 DIAGNOSIS — Z3480 Encounter for supervision of other normal pregnancy, unspecified trimester: Secondary | ICD-10-CM

## 2022-01-09 LAB — POCT URINALYSIS DIPSTICK OB
Glucose, UA: NEGATIVE
POC,PROTEIN,UA: NEGATIVE

## 2022-01-09 NOTE — Progress Notes (Signed)
ROB.Patient states no questions or concerns at this time.  

## 2022-01-09 NOTE — Progress Notes (Signed)
Routine Prenatal Care Visit  Subjective  Cynthia Jackson is a 36 y.o. [phone] at [redacted]w[redacted]d being seen today for ongoing prenatal care.  She is currently monitored for the following issues for this low-risk pregnancy and has Supervision of other normal pregnancy, antepartum; Anxiety; Carcinoma in situ of cervix; Marijuana use during pregnancy; and Dental caries on their problem list.  ----------------------------------------------------------------------------------- Patient reports no complaints.  Doing well.  Has had a few B-H ctx.  Is undecided about scheduling a postdates IOL, is concerned about risks, she had a friend recently need an induction and ended up needing a blood transfusion. Reviewed risks of induction and postdates pregnancy.  -fetus OP today, reviewed [place] circuit  Contractions: Irritability. Vag. Bleeding: None.  Movement: Present. Leaking Fluid denies.  ----------------------------------------------------------------------------------- The following portions of the patient's history were reviewed and updated as appropriate: allergies, current medications, past family history, past medical history, past social history, past surgical history and problem list. Problem list updated.  Objective  Blood pressure (!) 106/58, weight 159 lb 6.4 oz (72.3 kg), last menstrual period 04/15/2021, unknown if currently breastfeeding. Pregravid weight 105 lb (47.6 kg) Total Weight Gain 54 lb 6.4 oz (24.7 kg) Urinalysis: Urine Protein Negative  Urine Glucose Negative  Fetal Status: Fetal Heart Rate (bpm): 144 Fundal Height: 39 cm Movement: Present  Presentation: Vertex  General:  Alert, oriented and cooperative. Patient is in no acute distress.  Skin: Skin is warm and dry. No rash noted.   Cardiovascular: Normal heart rate noted  Respiratory: Normal respiratory effort, no problems with respiration noted  Abdomen: Soft, gravid, appropriate for gestational age. Pain/Pressure: Present     Pelvic:   Cervical exam performed Dilation: 1 Effacement (%): 50 Station: Ballotable  Extremities: Normal range of motion.     Mental Status: Normal mood and affect. Normal behavior. Normal judgment and thought content.   Assessment   36 y.o. [id number] at [redacted]w[redacted]d by  01/11/2022, by Ultrasound presenting for work-in prenatal visit  Plan   pregnancy 4 Problems (from 05/17/21 to present)     Problem Noted Resolved   Supervision of other normal pregnancy, antepartum 05/17/2021 by Ellwood Sayers, CNM No   Overview Addendum 11/15/2021  8:24 AM by Ellwood Sayers, CNM     Nursing Staff Provider  Office Location  [facility] Dating  By 6 week ultrasound  Language  English  Anatomy US  Normal female  Flu Vaccine   Genetic Screen  NIPS: neg, xx  TDaP vaccine   11/15/21 Hgb A1C or  GTT Early : NA Third trimester :   Covid    LAB RESULTS   Rhogam  na Blood Type O/Positive/-- (11/29 1629)   Feeding Plan breast Antibody Negative (11/29 1629)  Contraception BTS Rubella 1.17 (11/29 1629)  Circumcision NA RPR Non Reactive (05/02 0929)   Pediatrician  [facility] vs [facility] HBsAg immune  Support Person Dustin  HIV Non reactive  Prenatal Classes  Varicella Immune    GBS  (For PCN allergy, check sensitivities)   BTL Consent Signed 10/18/21    VBAC Consent  Pap      Hgb Electro    Pelvis Tested  CF      SMA                    Term labor symptoms and general obstetric precautions including but not limited to vaginal bleeding, contractions, leaking of fluid and fetal movement were reviewed in detail with the patient. Please refer  to After Visit Summary for other counseling recommendations.   Return in about 1 week (around 01/16/2022) for ROB, NST.  If pt chooses to decline IOL, will need [place] for AFI scheduled.   Carie Caddy, CNM  Domingo Pulse, [facility]  01/09/22  10:50 AM

## 2022-01-10 ENCOUNTER — Telehealth: Payer: Self-pay

## 2022-01-10 NOTE — Telephone Encounter (Signed)
Patient contacted office back stating that she saw [facility] yesterday in office and had discussed getting induced. Patient states that at visit she had let Isabelle Course know she would discuss with her spouse and decide, patient states that spouse had approved of her getting induced. Patient wants to know if will schedule her induction or will she have to wait till her appt on Monday 01/16/22 when she sees Erskine Squibb? Patient has been advised that Isabelle Course is not in the office until Friday, patient asks that message be forward for review. KW

## 2022-01-11 ENCOUNTER — Encounter: Payer: Self-pay | Admitting: Licensed Practical Nurse

## 2022-01-11 NOTE — Telephone Encounter (Signed)
Pt aware; L&D # given.

## 2022-01-11 NOTE — Telephone Encounter (Signed)
Attempted to reach patient but voicemail has not been set up yet. Please reach back out to patient. KW

## 2022-01-12 ENCOUNTER — Inpatient Hospital Stay: Payer: Medicaid Other | Admitting: Anesthesiology

## 2022-01-12 ENCOUNTER — Encounter: Payer: Self-pay | Admitting: Obstetrics and Gynecology

## 2022-01-12 ENCOUNTER — Inpatient Hospital Stay
Admission: EM | Admit: 2022-01-12 | Discharge: 2022-01-14 | DRG: 806 | Disposition: A | Discharge status: Home / Self Care | Payer: Medicaid Other | Attending: Advanced Practice Midwife | Admitting: Advanced Practice Midwife

## 2022-01-12 ENCOUNTER — Other Ambulatory Visit: Payer: Self-pay

## 2022-01-12 DIAGNOSIS — O99344 Other mental disorders complicating childbirth: Secondary | ICD-10-CM | POA: Diagnosis not present

## 2022-01-12 DIAGNOSIS — F1210 Cannabis abuse, uncomplicated: Secondary | ICD-10-CM

## 2022-01-12 DIAGNOSIS — D069 Carcinoma in situ of cervix, unspecified: Secondary | ICD-10-CM | POA: Diagnosis not present

## 2022-01-12 DIAGNOSIS — F1290 Cannabis use, unspecified, uncomplicated: Secondary | ICD-10-CM | POA: Diagnosis present

## 2022-01-12 DIAGNOSIS — O99334 Smoking (tobacco) complicating childbirth: Secondary | ICD-10-CM | POA: Diagnosis present

## 2022-01-12 DIAGNOSIS — F17210 Nicotine dependence, cigarettes, uncomplicated: Secondary | ICD-10-CM | POA: Diagnosis present

## 2022-01-12 DIAGNOSIS — O9A113 Malignant neoplasm complicating pregnancy, third trimester: Secondary | ICD-10-CM | POA: Diagnosis not present

## 2022-01-12 DIAGNOSIS — O99324 Drug use complicating childbirth: Secondary | ICD-10-CM | POA: Diagnosis present

## 2022-01-12 DIAGNOSIS — Z3480 Encounter for supervision of other normal pregnancy, unspecified trimester: Secondary | ICD-10-CM

## 2022-01-12 DIAGNOSIS — O479 False labor, unspecified: Secondary | ICD-10-CM | POA: Diagnosis present

## 2022-01-12 DIAGNOSIS — F419 Anxiety disorder, unspecified: Secondary | ICD-10-CM

## 2022-01-12 DIAGNOSIS — O480 Post-term pregnancy: Principal | ICD-10-CM | POA: Diagnosis present

## 2022-01-12 DIAGNOSIS — Z3A40 40 weeks gestation of pregnancy: Secondary | ICD-10-CM | POA: Diagnosis not present

## 2022-01-12 DIAGNOSIS — O326XX0 Maternal care for compound presentation, not applicable or unspecified: Secondary | ICD-10-CM | POA: Diagnosis present

## 2022-01-12 LAB — CBC
HCT: 33.5 % — ABNORMAL LOW (ref 36.0–46.0)
Hemoglobin: 11.3 g/dL — ABNORMAL LOW (ref 12.0–15.0)
MCH: 30.5 pg (ref 26.0–34.0)
MCHC: 33.7 g/dL (ref 30.0–36.0)
MCV: 90.3 fL (ref 80.0–100.0)
Platelets: 239 10*3/uL (ref 150–400)
RBC: 3.71 MIL/uL — ABNORMAL LOW (ref 3.87–5.11)
RDW: 12.6 % (ref 11.5–15.5)
WBC: 14 10*3/uL — ABNORMAL HIGH (ref 4.0–10.5)
nRBC: 0 % (ref 0.0–0.2)

## 2022-01-12 LAB — URINE DRUG SCREEN, QUALITATIVE (ARMC ONLY)
Amphetamines, Ur Screen: NOT DETECTED
Barbiturates, Ur Screen: NOT DETECTED
Benzodiazepine, Ur Scrn: NOT DETECTED
Cannabinoid 50 Ng, Ur ~~LOC~~: NOT DETECTED
Cocaine Metabolite,Ur ~~LOC~~: NOT DETECTED
MDMA (Ecstasy)Ur Screen: NOT DETECTED
Methadone Scn, Ur: NOT DETECTED
Opiate, Ur Screen: NOT DETECTED
Phencyclidine (PCP) Ur S: NOT DETECTED
Tricyclic, Ur Screen: NOT DETECTED

## 2022-01-12 LAB — TYPE AND SCREEN
ABO/RH(D): O POS
Antibody Screen: NEGATIVE

## 2022-01-12 LAB — ABO/RH: ABO/RH(D): O POS

## 2022-01-12 IMAGING — US US OB TRANSVAGINAL
1 series · 14 of 28 positions shown · non-contrast
Comparison: None.

CLINICAL DATA: Vaginal bleeding in early pregnancy for 3 days.

EXAM:
TRANSVAGINAL OB ULTRASOUND
TECHNIQUE: Transvaginal ultrasound was performed for complete evaluation of the
gestation as well as the maternal uterus, adnexal regions, and
pelvic cul-de-sac.

[Series 1: us ob transvaginal · 0.08mm/px · 14 of 46 slices shown]
[im 2/46]
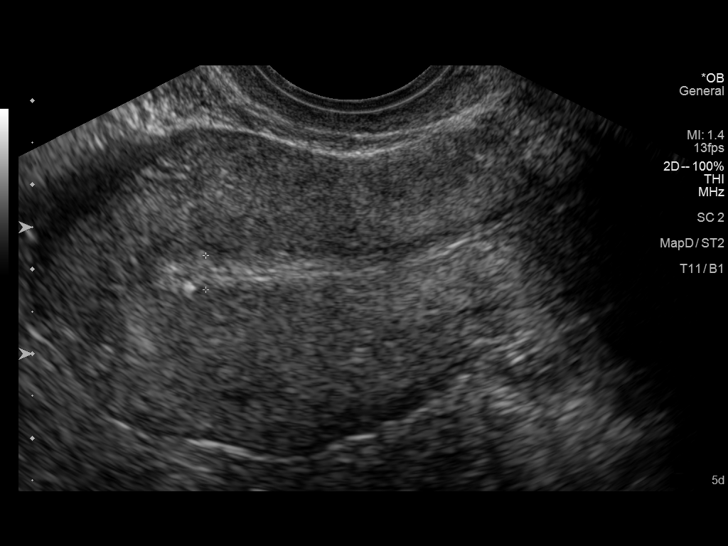
[im 6/46]
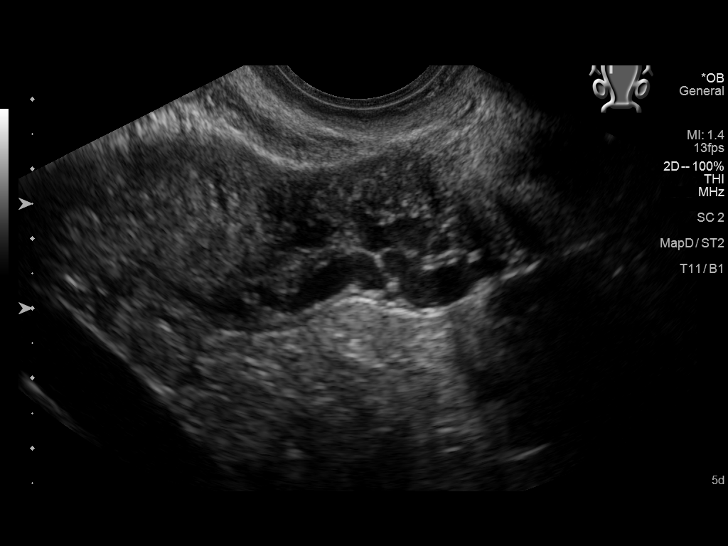
[im 9/46]
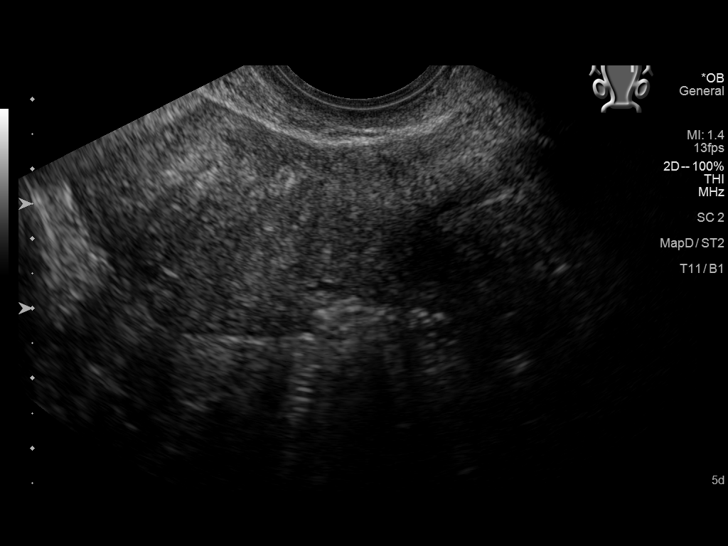
[im 12/46]
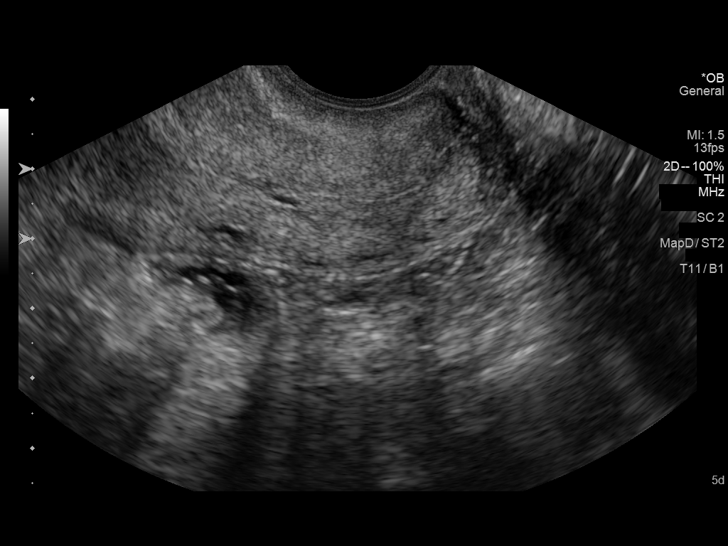
[im 16/46]
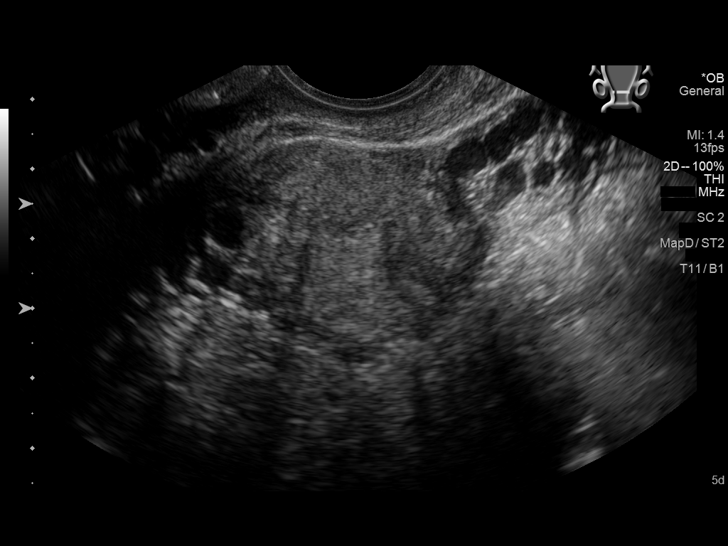
[im 19/46]
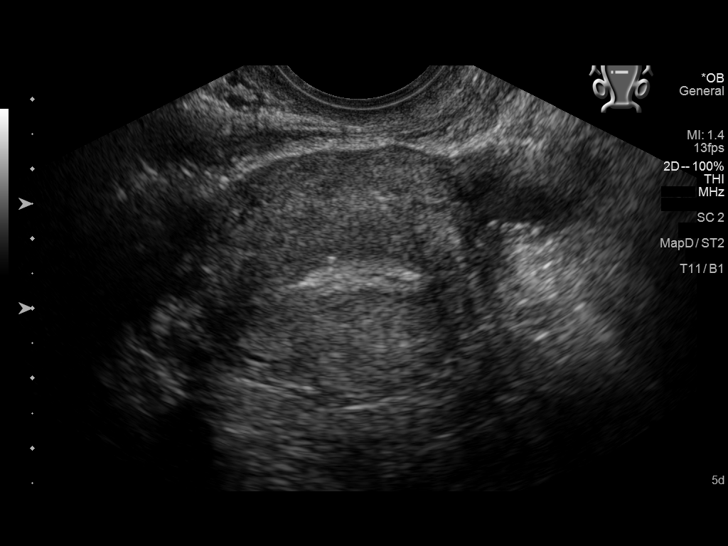
[im 22/46]
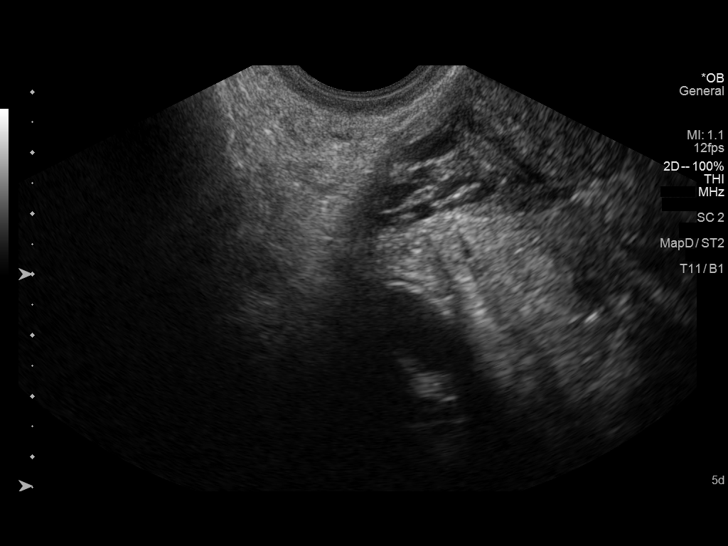
[im 26/46]
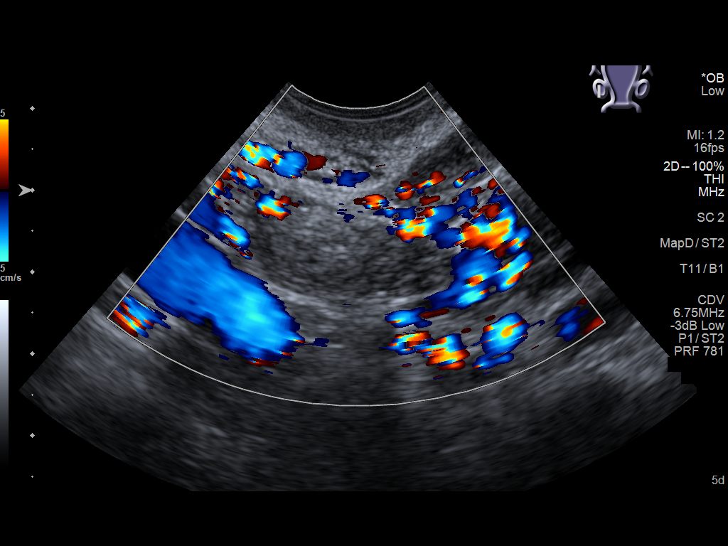
[im 29/46]
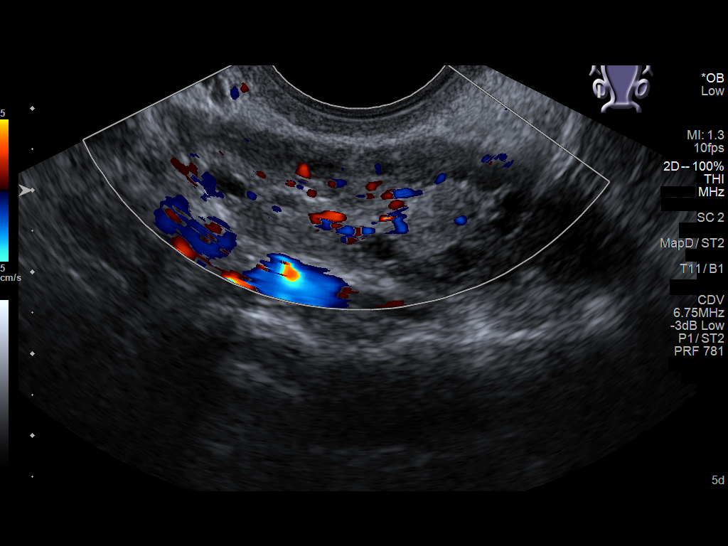
[im 32/46]
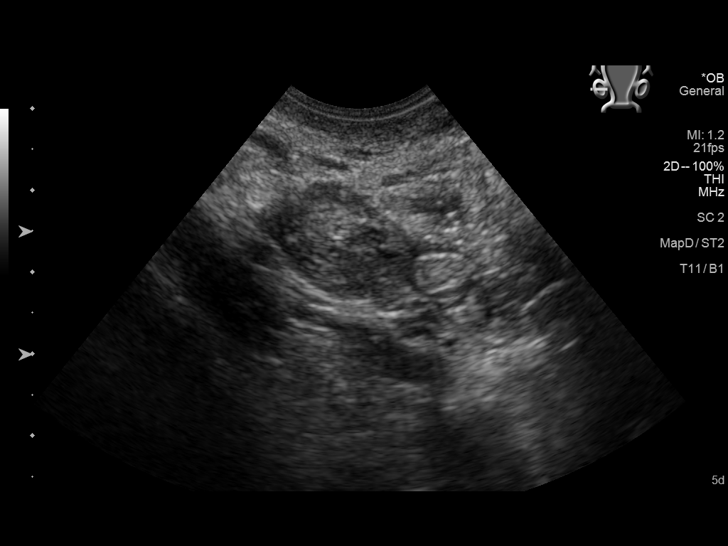
[im 36/46]
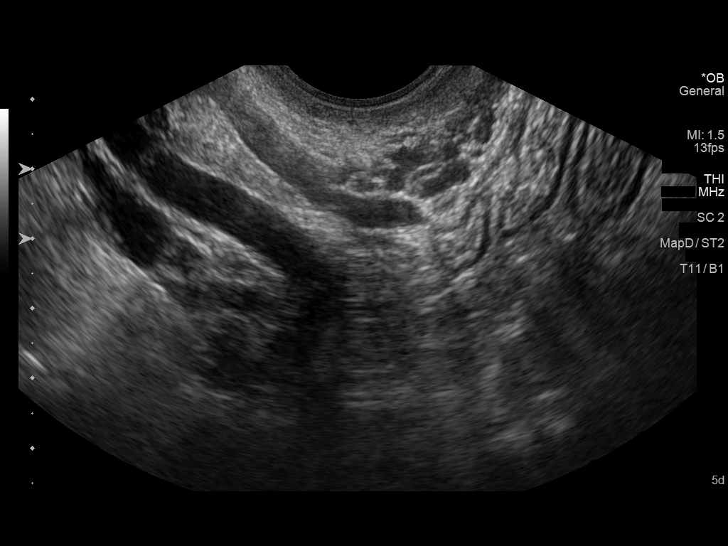
[im 39/46]
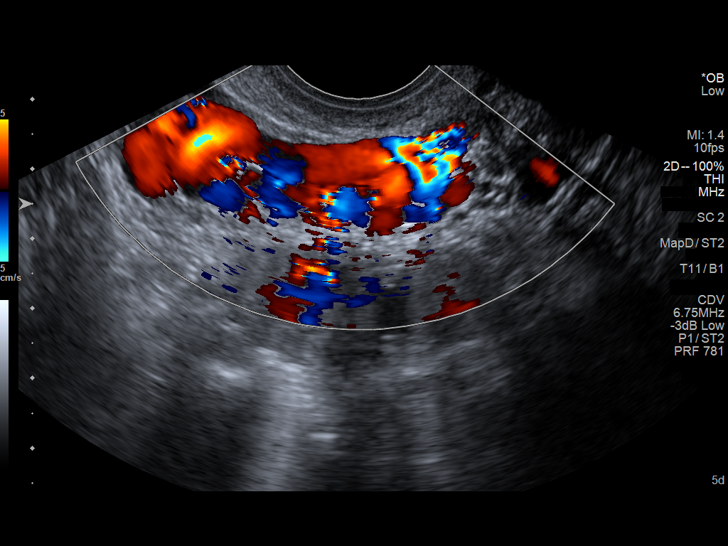
[im 42/46]
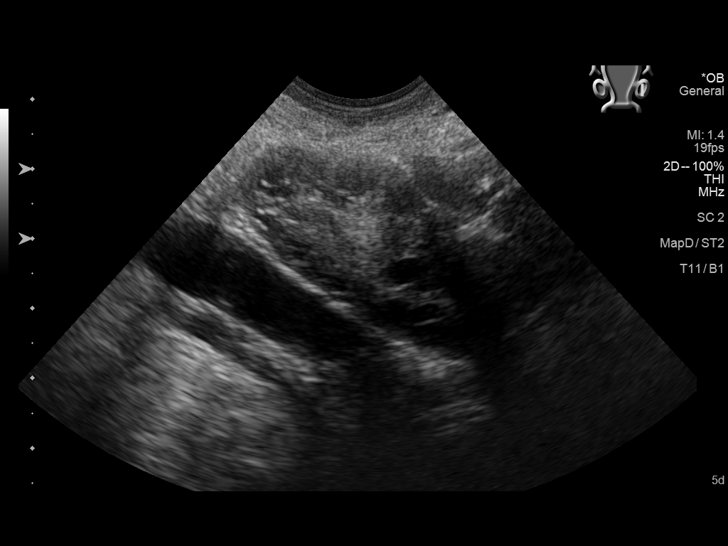
[im 46/46]
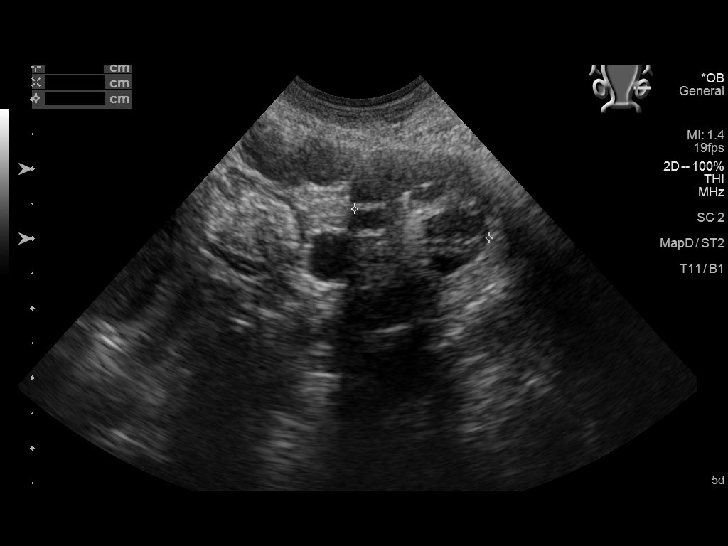

[14 of 28 positions shown; findings below may reference images not displayed]

FINDINGS: Intrauterine gestational sac: None

Maternal uterus/adnexae: Endometrial thickness measures 4 mm. No
fibroids identified.

Both ovaries are normal in appearance. A small heterogeneous mass is
seen in the right adnexa which is separate from the ovary. This
measures 2.1 x 1.4 x 1.2 cm. No evidence of free fluid.
IMPRESSION: 2.1 cm right adnexal mass, highly suspicious for ectopic pregnancy.

No evidence of hemoperitoneum.

Critical Value/emergent results were called by telephone at the time
of interpretation on 01/31/2021 at [DATE] to provider ZAZACAN
SHERIDAN, who verbally acknowledged these results.

## 2022-01-12 MED ORDER — EPHEDRINE 5 MG/ML INJ: 10.0000 mg | INTRAVENOUS | Status: DC | PRN

## 2022-01-12 MED ORDER — LACTATED RINGERS IV SOLN: INTRAVENOUS | Status: DC

## 2022-01-12 MED ORDER — LACTATED RINGERS IV SOLN: 500.0000 mL | Freq: Once | INTRAVENOUS | Status: DC

## 2022-01-12 MED ORDER — OXYTOCIN-SODIUM CHLORIDE 30-0.9 UT/500ML-% IV SOLN
2.5000 [IU]/h | INTRAVENOUS | Status: DC
  Filled 2022-01-12: qty 500

## 2022-01-12 MED ORDER — LACTATED RINGERS IV SOLN: 500.0000 mL | INTRAVENOUS | Status: DC | PRN

## 2022-01-12 MED ORDER — BUPIVACAINE HCL (PF) 0.25 % IJ SOLN
INTRAMUSCULAR | Status: DC | PRN
  Administered 2022-01-12: 8 mL via EPIDURAL

## 2022-01-12 MED ORDER — ONDANSETRON HCL 4 MG/2ML IJ SOLN: 4.0000 mg | Freq: Four times a day (QID) | INTRAMUSCULAR | Status: DC | PRN

## 2022-01-12 MED ORDER — LIDOCAINE HCL (PF) 1 % IJ SOLN
30.0000 mL | INTRAMUSCULAR | Status: DC | PRN
  Filled 2022-01-12: qty 30

## 2022-01-12 MED ORDER — ACETAMINOPHEN 325 MG PO TABS: 650.0000 mg | ORAL_TABLET | ORAL | Status: DC | PRN

## 2022-01-12 MED ORDER — FENTANYL-BUPIVACAINE-NACL 0.5-0.125-0.9 MG/250ML-% EP SOLN
12.0000 mL/h | EPIDURAL | Status: DC | PRN
  Administered 2022-01-12: 12 mL/h via EPIDURAL
  Filled 2022-01-12: qty 250

## 2022-01-12 MED ORDER — LIDOCAINE-EPINEPHRINE (PF) 1.5 %-1:200000 IJ SOLN
INTRAMUSCULAR | Status: DC | PRN
  Administered 2022-01-12: 3 mL via EPIDURAL

## 2022-01-12 MED ORDER — DIPHENHYDRAMINE HCL 50 MG/ML IJ SOLN: 12.5000 mg | INTRAMUSCULAR | Status: DC | PRN

## 2022-01-12 MED ORDER — PHENYLEPHRINE 80 MCG/ML (10ML) SYRINGE FOR IV PUSH (FOR BLOOD PRESSURE SUPPORT): 80.0000 ug | PREFILLED_SYRINGE | INTRAVENOUS | Status: DC | PRN

## 2022-01-12 MED ORDER — OXYTOCIN BOLUS FROM INFUSION: 333.0000 mL | Freq: Once | INTRAVENOUS | Status: DC

## 2022-01-12 NOTE — Anesthesia Preprocedure Evaluation (Addendum)
Anesthesia Evaluation  Patient identified by MRN, date of birth, ID band Patient awake    Reviewed: Allergy & Precautions, NPO status , Patient's Chart, lab work & pertinent test results  History of Anesthesia Complications Negative for: history of anesthetic complications  Airway Mallampati: IV   Neck ROM: Full    Dental  (+) Poor Dentition   Pulmonary Current SmokerPatient did not abstain from smoking.,    Pulmonary exam normal breath sounds clear to auscultation       Cardiovascular Exercise Tolerance: Good negative cardio ROS Normal cardiovascular exam Rhythm:Regular Rate:Normal     Neuro/Psych THC use    GI/Hepatic negative GI ROS,   Endo/Other  negative endocrine ROS  Renal/GU negative Renal ROS     Musculoskeletal   Abdominal   Peds  Hematology negative hematology ROS (+)   Anesthesia Other Findings 36 yo [id number] at 40 weeks requesting labor epidural.  Reproductive/Obstetrics                            Anesthesia Physical Anesthesia Plan  ASA: 2  Anesthesia Plan: Epidural   Post-op Pain Management:    Induction:   PONV Risk Score and Plan: 2 and Treatment may vary due to age or medical condition  Airway Management Planned: Natural Airway  Additional Equipment:   Intra-op Plan:   Post-operative Plan:   Informed Consent: I have reviewed the patients History and Physical, chart, labs and discussed the procedure including the risks, benefits and alternatives for the proposed anesthesia with the patient or authorized representative who has indicated his/her understanding and acceptance.     Dental Advisory Given  Plan Discussed with:   Anesthesia Plan Comments: (Patient reports no bleeding problems and no anticoagulant use.   Patient consented for risks of anesthesia including but not limited to:  - adverse reactions to medications - risk of bleeding, infection  and or nerve damage from epidural that could lead to paralysis - risk of headache or failed epidural - nerve damage due to positioning - that if epidural is used for C-section that there is a chance of epidural failure requiring spinal placement or conversion to GA - Damage to heart, brain, lungs, other parts of body or loss of life  Patient voiced understanding.)        Anesthesia Quick Evaluation

## 2022-01-12 NOTE — Anesthesia Procedure Notes (Signed)
Epidural Patient location during procedure: OB Start time: 01/12/2022 7:43 PM End time: 01/12/2022 7:51 PM  Staffing Anesthesiologist: Reed Breech, MD Performed: anesthesiologist   Preanesthetic Checklist Completed: patient identified, IV checked, risks and benefits discussed, surgical consent, monitors and equipment checked, pre-op evaluation and timeout performed  Epidural Patient position: sitting Prep: Betadine Patient monitoring: heart rate, continuous pulse ox and blood pressure Approach: midline Location: L2-L3 Injection technique: LOR air  Needle:  Needle type: Tuohy  Needle gauge: 17 G Needle length: 9 cm Needle insertion depth: 4 cm Catheter at skin depth: 9 cm Test dose: negative and 1.5% lidocaine with Epi 1:200 K  Assessment Sensory level: T4  Additional Notes Straightforward placement without apparent complications.Reason for block:procedure for pain

## 2022-01-12 NOTE — Progress Notes (Signed)
Pt removed from fetal monitoring at this time to walk and ambulate for an hour before a cervical recheck.

## 2022-01-12 NOTE — Progress Notes (Signed)
  Labor Progress Note   36 y.o. [id number] @ [redacted]w[redacted]d , admitted for  Pregnancy, Labor Management.   Subjective:  Comfortable with epidural  Objective:  BP (!) 110/51   Pulse 79   Temp 97.6 F (36.4 C) (Oral)   Resp 18   Ht 5\' 1"  (1.549 m)   Wt 72.1 kg   LMP 04/15/2021   SpO2 100%   BMI 30.04 kg/m  Abd: gravid, ND, FHT present, mild tenderness on exam Extr: no edema SVE: CERVIX: 8 cm dilated, 90 effaced, -1 station AROM clear  EFM: FHR: 155 bpm, variability: moderate,  accelerations:  Present,  decelerations:  Absent Toco: Frequency: Every 2-3 minutes minutes Labs: I have reviewed the patient's lab results.   Assessment & Plan:  04/17/2021 @ [redacted]w[redacted]d, admitted for  Pregnancy and Labor/Delivery Management  1. Pain management: epidural. 2. FWB: FHT category I.  3. ID: GBS negative 4. Labor management: expectant management for vaginal delivery  All discussed with patient, see orders   [redacted]w[redacted]d, CNM [facility] 01/12/2022  9:39 PM

## 2022-01-12 NOTE — H&P (Signed)
OB History & Physical   History of Present Illness:  Chief Complaint: contractions  HPI:  Cynthia Jackson is a 36 y.o. [phone] female at [redacted]w[redacted]d dated by 6 week ultrasound.  Her pregnancy has been complicated by; Anxiety; Carcinoma in situ of cervix; Marijuana use during pregnancy; and Dental caries.    She reports contractions since this morning.   She denies leakage of fluid.   She denies vaginal bleeding.   She reports fetal movement.    Total weight gain for pregnancy: 24.5 kg   Obstetrical Problem List: pregnancy 4 Problems (from 05/17/21 to present)     Problem Noted Resolved   Supervision of other normal pregnancy, antepartum 05/17/2021 by Ellwood Sayers, CNM No   Overview Addendum 11/15/2021  8:24 AM by Ellwood Sayers, CNM     Nursing Staff Provider  Office Location  [facility] Dating  By 6 week ultrasound  Language  English  Anatomy US  Normal female  Flu Vaccine   Genetic Screen  NIPS: neg, xx  TDaP vaccine   11/15/21 Hgb A1C or  GTT Early : NA Third trimester :   Covid    LAB RESULTS   Rhogam  na Blood Type O/Positive/-- (11/29 1629)   Feeding Plan breast Antibody Negative (11/29 1629)  Contraception BTS Rubella 1.17 (11/29 1629)  Circumcision NA RPR Non Reactive (05/02 0929)   Pediatrician  [facility] vs [facility] HBsAg immune  Support Person Dustin  HIV Non reactive  Prenatal Classes  Varicella Immune    GBS  (For PCN allergy, check sensitivities)   BTL Consent Signed 10/18/21    VBAC Consent  Pap      Hgb Electro    Pelvis Tested  CF      SMA                   Maternal Medical History:  History reviewed. No pertinent past medical history.  Past Surgical History:  Procedure Laterality Date   KNEE SURGERY Left     Allergies  Allergen Reactions   Other Anaphylaxis    Fresh and vegetables allergy    Prior to Admission medications   Medication Sig Start Date End Date Taking? Authorizing Provider  acetaminophen (TYLENOL) 500 MG tablet  Take 500 mg by mouth every 6 (six) hours as needed.   Yes [provider]  prenatal vitamin w/FE, FA (PRENATAL 1 + 1) 27-1 MG TABS tablet Take 1 tablet by mouth daily at 12 noon.   Yes [provider]    OB History  Gravida Para Term Preterm AB Living  4 2 2  0 1 2  SAB IAB Ectopic Multiple Live Births      1   2    # Outcome Date GA Lbr Len/2nd Weight Sex Delivery Anes PTL Lv  4 Current           3 Ectopic 2022          2 Term 2012    M    LIV  1 Term 2008    F    LIV    Prenatal care site: [facility]  Social History: She  reports that she has been smoking cigarettes. She has a 16.00 pack-year smoking history. She has never used smokeless tobacco. She reports that she does not currently use drugs after having used the following drugs: Marijuana. She reports that she does not drink alcohol.  Family History: family history is not on file.  Review of Systems:  Review of Systems  Constitutional:  Negative for chills and fever.  HENT:  Negative for congestion, ear discharge, ear pain, hearing loss, sinus pain and sore throat.   Eyes:  Negative for blurred vision and double vision.  Respiratory:  Negative for cough, shortness of breath and wheezing.   Cardiovascular:  Negative for chest pain, palpitations and leg swelling.  Gastrointestinal:  Positive for abdominal pain. Negative for blood in stool, constipation, diarrhea, heartburn, melena, nausea and vomiting.  Genitourinary:  Negative for dysuria, flank pain, frequency, hematuria and urgency.  Musculoskeletal:  Negative for back pain, joint pain and myalgias.  Skin:  Negative for itching and rash.  Neurological:  Negative for dizziness, tingling, tremors, sensory change, speech change, focal weakness, seizures, loss of consciousness, weakness and headaches.  Endo/Heme/Allergies:  Negative for environmental allergies. Does not bruise/bleed easily.  Psychiatric/Behavioral:  Negative for depression,  hallucinations, memory loss, substance abuse and suicidal ideas. The patient is not nervous/anxious and does not have insomnia.      Physical Exam:  BP (!) 115/50   Pulse 84   Temp 97.6 F (36.4 C) (Oral)   Resp 18   Ht 5\' 1"  (1.549 m)   Wt 72.1 kg   LMP 04/15/2021   SpO2 98%   BMI 30.04 kg/m   Constitutional: Well nourished, well developed female in no acute distress.  HEENT: normal Skin: Warm and dry.  Cardiovascular: Regular rate and rhythm.   Extremity:  no edema   Respiratory: Clear to auscultation bilateral. Normal respiratory effort Abdomen: FHT present Back: no CVAT Neuro: DTRs 2+, Cranial nerves grossly intact Psych: Alert and Oriented x3. No memory deficits. Normal mood and affect.  MS: normal gait, normal bilateral lower extremity ROM/strength/stability.  Pelvic exam: per RN B. 04/17/2021 Change from 3-4 cm in triage 4/90/-3/bbow   Baseline FHR: 150 beats/min   Variability: moderate   Accelerations: present   Decelerations: variable present Contractions: present frequency: every 2-3 minutes Overall assessment: reassuring   Lab Results  Component Value Date   SARSCOV2NAA NEGATIVE 01/31/2021    Assessment:  Cynthia Jackson is a 36 y.o. 32 female at [redacted]w[redacted]d with active labor.   Plan:  Admit to Labor & Delivery  CBC, T&S, Clrs, IVF GBS negative.   Fetal well-being: Category I currently Expected management for vaginal delivery    [redacted]w[redacted]d, [facility] 01/12/2022 8:26 PM

## 2022-01-12 NOTE — OB Triage Note (Signed)
Pt reports to labor and delivery c/o ctx that started around 10 am today. Pt denies bleeding or LOF. Pt reports positive fetal movement. Pt rates ctx pain 8/10. VSS. Will continue to monitor.

## 2022-01-13 ENCOUNTER — Encounter: Payer: Self-pay | Admitting: Obstetrics and Gynecology

## 2022-01-13 DIAGNOSIS — Z3A40 40 weeks gestation of pregnancy: Secondary | ICD-10-CM

## 2022-01-13 LAB — CBC
HCT: 30.6 % — ABNORMAL LOW (ref 36.0–46.0)
Hemoglobin: 10.1 g/dL — ABNORMAL LOW (ref 12.0–15.0)
MCH: 30.6 pg (ref 26.0–34.0)
MCHC: 33 g/dL (ref 30.0–36.0)
MCV: 92.7 fL (ref 80.0–100.0)
Platelets: 195 10*3/uL (ref 150–400)
RBC: 3.3 MIL/uL — ABNORMAL LOW (ref 3.87–5.11)
RDW: 12.5 % (ref 11.5–15.5)
WBC: 16 10*3/uL — ABNORMAL HIGH (ref 4.0–10.5)
nRBC: 0 % (ref 0.0–0.2)

## 2022-01-13 LAB — RPR: RPR Ser Ql: NONREACTIVE

## 2022-01-13 MED ORDER — ACETAMINOPHEN 325 MG PO TABS: 650.0000 mg | ORAL_TABLET | ORAL | Status: DC | PRN

## 2022-01-13 MED ORDER — ONDANSETRON HCL 4 MG PO TABS: 4.0000 mg | ORAL_TABLET | ORAL | Status: DC | PRN

## 2022-01-13 MED ORDER — IBUPROFEN 600 MG PO TABS
600.0000 mg | ORAL_TABLET | Freq: Four times a day (QID) | ORAL | Status: DC
Start: 2022-01-13 — End: 2022-01-14
  Administered 2022-01-13 – 2022-01-14 (×5): 600 mg via ORAL
  Filled 2022-01-13 (×5): qty 1

## 2022-01-13 MED ORDER — PRENATAL MULTIVITAMIN CH
1.0000 | ORAL_TABLET | Freq: Every day | ORAL | Status: DC
  Administered 2022-01-13: 1 via ORAL
  Filled 2022-01-13: qty 1

## 2022-01-13 MED ORDER — SENNOSIDES-DOCUSATE SODIUM 8.6-50 MG PO TABS
2.0000 | ORAL_TABLET | Freq: Every day | ORAL | Status: DC
  Administered 2022-01-14: 2 via ORAL
  Filled 2022-01-13: qty 2

## 2022-01-13 MED ORDER — DIBUCAINE (PERIANAL) 1 % EX OINT: 1.0000 | TOPICAL_OINTMENT | CUTANEOUS | Status: DC | PRN

## 2022-01-13 MED ORDER — TETANUS-DIPHTH-ACELL PERTUSSIS 5-2.5-18.5 LF-MCG/0.5 IM SUSY: 0.5000 mL | PREFILLED_SYRINGE | Freq: Once | INTRAMUSCULAR | Status: DC

## 2022-01-13 MED ORDER — WITCH HAZEL-GLYCERIN EX PADS: 1.0000 | MEDICATED_PAD | CUTANEOUS | Status: DC | PRN

## 2022-01-13 MED ORDER — COCONUT OIL OIL: 1.0000 | TOPICAL_OIL | Status: DC | PRN

## 2022-01-13 MED ORDER — BENZOCAINE-MENTHOL 20-0.5 % EX AERO: 1.0000 | INHALATION_SPRAY | CUTANEOUS | Status: DC | PRN

## 2022-01-13 MED ORDER — DIPHENHYDRAMINE HCL 25 MG PO CAPS: 25.0000 mg | ORAL_CAPSULE | Freq: Four times a day (QID) | ORAL | Status: DC | PRN

## 2022-01-13 MED ORDER — ONDANSETRON HCL 4 MG/2ML IJ SOLN: 4.0000 mg | INTRAMUSCULAR | Status: DC | PRN

## 2022-01-13 MED ORDER — SIMETHICONE 80 MG PO CHEW: 80.0000 mg | CHEWABLE_TABLET | ORAL | Status: DC | PRN

## 2022-01-13 NOTE — Lactation Note (Signed)
This note was copied from a baby's chart. Lactation Consultation Note  Patient Name: Cynthia Jackson Today's Date: 01/13/2022 Reason for consult: Follow-up assessment Age:34 hours  Maternal Data    Feeding Mother's Current Feeding Choice: Breast Milk Mom had just latched baby to left breast in cradle hold, nursing well, swallows noted LATCH Score Latch: Grasps breast easily, tongue down, lips flanged, rhythmical sucking.  Audible Swallowing: A few with stimulation  Type of Nipple: Everted at rest and after stimulation  Comfort (Breast/Nipple): Soft / non-tender  Hold (Positioning): No assistance needed to correctly position infant at breast.  LATCH Score: 9   Lactation Tools Discussed/Used    Interventions Interventions: Education (lanolin given with instruction in use)  Discharge Pump: Declined  Consult Status Consult Status: PRN    Dyann Kief 01/13/2022, 3:00 PM

## 2022-01-13 NOTE — Progress Notes (Signed)
Post Partum Day 0 Subjective: Cynthia Jackson is feeling well. She is having cramps but her pain is otherwise well-controlled. She is tolerating POs, ambulating and voiding without difficulty. Her bleeding is WNL. Breastfeeding is going well.  Objective: Blood pressure (!) 98/54, pulse 68, temperature 98.6 F (37 C), temperature source Oral, resp. rate 20, height 5\' 1"  (1.549 m), weight 72.1 kg, last menstrual period 04/15/2021, SpO2 100 %, unknown if currently breastfeeding.  Physical Exam:  General: alert, cooperative, and appears stated age Lochia: appropriate Uterine Fundus: firm Incision: N/A DVT Evaluation: No evidence of DVT seen on physical exam.  Recent Labs    01/12/22 1856 01/13/22 0538  HGB 11.3* 10.1*  HCT 33.5* 30.6*    Assessment/Plan: Plan for discharge tomorrow Lactation assistance as needed   LOS: 1 day   01/15/22, CNM 01/13/2022, 10:01 AM

## 2022-01-13 NOTE — Lactation Note (Signed)
This note was copied from a baby's chart. Lactation Consultation Note  Patient Name: Cynthia Jackson Today's Date: 01/13/2022 Reason for consult: Initial assessment;Term Age:34 hours  Maternal Data Does the patient have breastfeeding experience prior to this delivery?: Yes How long did the patient breastfeed?: 9 mths with first, 4 mths with last  Feeding Mother's Current Feeding Choice: Breast Milk Mom states that she last fed baby at 0900 and baby nursed 60 + minutes only on right breast, encouraged mom to offer both breasts at a feeding, up to 30 min per side, observe for feeding cues  LATCH Score Latch:  (I did not observe a feeding)                  Lactation Tools Discussed/Used    Interventions Interventions: Breast feeding basics reviewed;Education LC name and no written on white board Discharge [facility] Program: Yes  Consult Status Consult Status: PRN    Dyann Kief 01/13/2022, 11:07 AM

## 2022-01-13 NOTE — Anesthesia Postprocedure Evaluation (Signed)
Anesthesia Post Note  Patient: Cynthia Jackson  Procedure(s) Performed: AN AD HOC LABOR EPIDURAL  Patient location during evaluation: Mother Baby Anesthesia Type: Epidural Level of consciousness: awake and alert Pain management: pain level controlled Vital Signs Assessment: post-procedure vital signs reviewed and stable Respiratory status: spontaneous breathing, nonlabored ventilation and respiratory function stable Cardiovascular status: stable Postop Assessment: no headache, no backache and epidural receding Anesthetic complications: no   No notable events documented.   Last Vitals:  Vitals:   01/13/22 0259 01/13/22 0407  BP: (!) 110/56 106/60  Pulse: 71 63  Resp: 18 18  Temp: 37.1 C 37.1 C  SpO2: 98% 98%    Last Pain:  Vitals:   01/13/22 0407  TempSrc: Oral  PainSc:                  Lynden Oxford

## 2022-01-13 NOTE — Discharge Summary (Signed)
OB Discharge Summary     Patient Name: Cynthia Jackson DOB: [birth year] MRN: [id number]  Date of admission: 01/12/2022 Delivering provider: Tresea Mall, CNM  Date of Delivery: 01/12/2022  Date of discharge: 01/14/2022  Admitting diagnosis: Uterine contractions [O47.9] Labor and delivery, indication for care [O75.9] Intrauterine pregnancy: [redacted]w[redacted]d     Secondary diagnosis: None     Discharge diagnosis: Term Pregnancy Delivered                                                                                                Post partum procedures: none  Augmentation: N/A  Complications: None  Hospital course:  Onset of Labor With Vaginal Delivery      36 y.o. yo [id number] at [redacted]w[redacted]d was admitted in Active Labor on 01/12/2022. Patient had an uncomplicated labor course as follows:  Membrane Rupture Time/Date: 9:09 PM ,01/12/2022   Delivery Method:Vaginal, Spontaneous  Episiotomy: None  Lacerations:  None  Patient had an uncomplicated postpartum course.  She is ambulating, tolerating a regular diet, passing flatus, and urinating well. Patient is discharged home in stable condition on 01/14/22.  Newborn Data: Birth date:01/12/2022  Birth time:11:41 PM  Gender:Female  Josephine Living status:Living  Apgars: 8, 9 Weight: 3270 g, 7 pounds 3 ounces  Physical exam  Vitals:   01/13/22 1236 01/13/22 1549 01/14/22 0027 01/14/22 0805  BP: 110/71 104/61 102/67 (!) 100/58  Pulse: 68 77 67 71  Resp: 20 18 18 20   Temp: 98.3 F (36.8 C) 98.1 F (36.7 C) 98.6 F (37 C) 98.1 F (36.7 C)  TempSrc: Oral Oral Oral Oral  SpO2: 100% 99% 99% 100%  Weight:      Height:       General: alert, cooperative, and no distress Lochia: appropriate Uterine Fundus: firm Incision: N/A DVT Evaluation: No evidence of DVT seen on physical exam. Negative Homan's sign.  Labs: Lab Results  Component Value Date   WBC 16.0 (H) 01/13/2022   HGB 10.1 (L) 01/13/2022   HCT 30.6 (L) 01/13/2022   MCV 92.7  01/13/2022   PLT 195 01/13/2022    Discharge instruction: per After Visit Summary.  Medications:  Allergies as of 01/14/2022       Reactions   Other Anaphylaxis   Fresh and vegetables allergy        Medication List     STOP taking these medications    acetaminophen 500 MG tablet Commonly known as: TYLENOL       TAKE these medications    prenatal vitamin w/FE, FA 27-1 MG Tabs tablet Take 1 tablet by mouth daily at 12 noon.        Diet: routine diet  Activity: Advance as tolerated. Pelvic rest for 6 weeks.   Outpatient follow up:  Follow-up Information     [facility]. Schedule an appointment as soon as possible for a visit in 2 week(s).   Specialty: Obstetrics and Gynecology Why: MD appointment to schedule tubal ligation  Contact information: [address] [place] [phone]  Postpartum contraception: Tubal Ligation Rhogam Given postpartum: Rh positive Rubella vaccine given postpartum: immune Varicella vaccine given postpartum: immune TDaP given antepartum or postpartum: antepartum   Newborn Delivery   Birth date/time: 01/12/2022 23:41:00 Delivery type: Vaginal, Spontaneous       Baby Feeding: Breast  Disposition:home with mother Will need repeat colpo postpartum. Plans interval tubal ligation SIGNED:  Mirna Mires, CNM 01/14/2022 11:46 AM

## 2022-01-14 NOTE — Progress Notes (Signed)
Pt discharged with infant.  Discharge instructions, prescriptions and follow up appointment given to and reviewed with pt. Pt verbalized understanding. Escorted out by staff. 

## 2022-01-16 ENCOUNTER — Encounter: Payer: Medicaid Other | Admitting: Advanced Practice Midwife

## 2022-02-13 ENCOUNTER — Telehealth: Payer: Self-pay

## 2022-02-13 NOTE — Telephone Encounter (Signed)
OB Care manager for [facility] Department calling to check on patient. She is unable to see [facility] visits in Epic. She is aware patients due date was earlier this month and does not see that patient has been seen recently. Advised patient delivered 01/12/22. She was advised on discharge to schedule follow up appt with MD to discuss tubal ligation. She is not currently scheduled for any visits. Debbe Odea reports she has made several home visits, but the patient was not at home. She will continue to follow up with patient and see that she gets scheduled for necessary follow up appointments.

## 2022-06-19 HISTORY — PX: MULTIPLE TOOTH EXTRACTIONS: SHX2053

## 2022-07-28 IMAGING — US US OB COMP +14 WK
1 series · 13 of 28 positions shown · non-contrast
Comparison: none

CLINICAL DATA: Fetal anatomic survey

EXAM:
OBSTETRICAL ULTRASOUND >14 WKS

[Series 1: ob us · 13 of 137 slices shown]
[im 6/137]
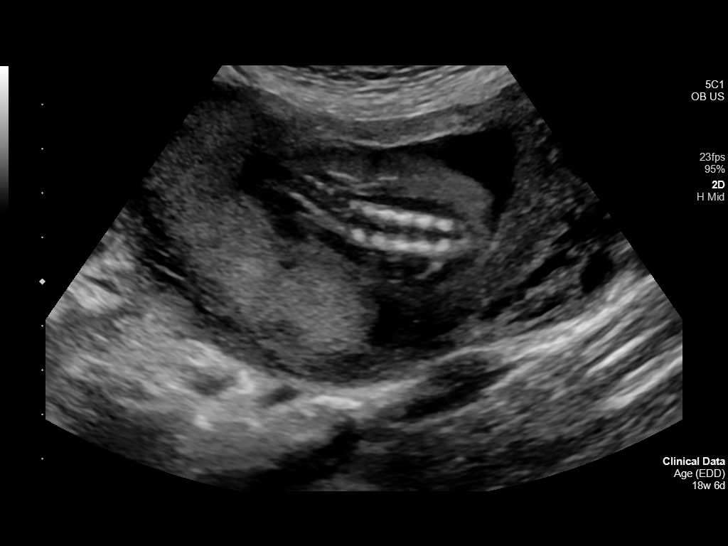
[im 16/137]
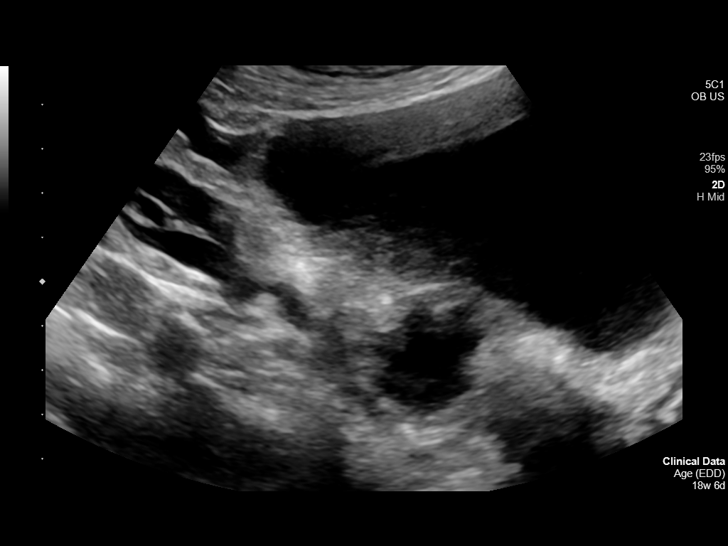
[im 26/137]
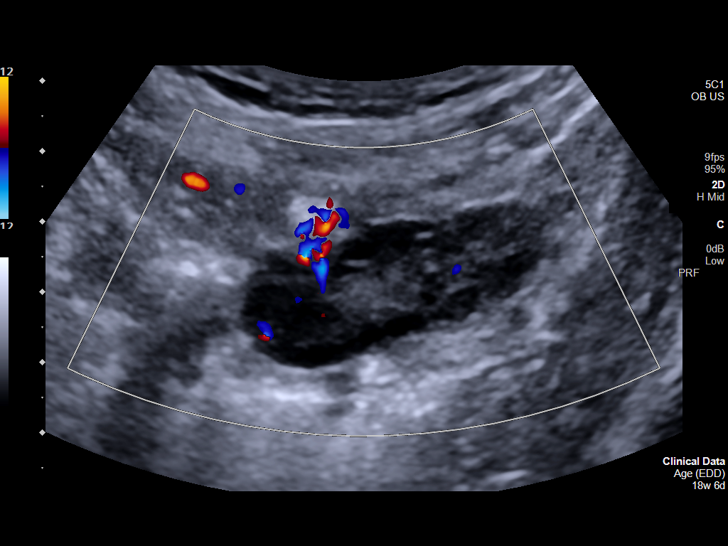
[im 36/137]
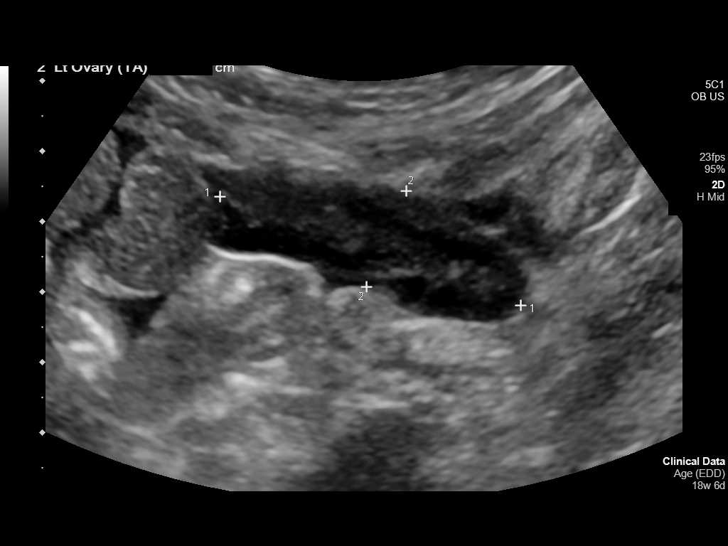
[im 46/137]
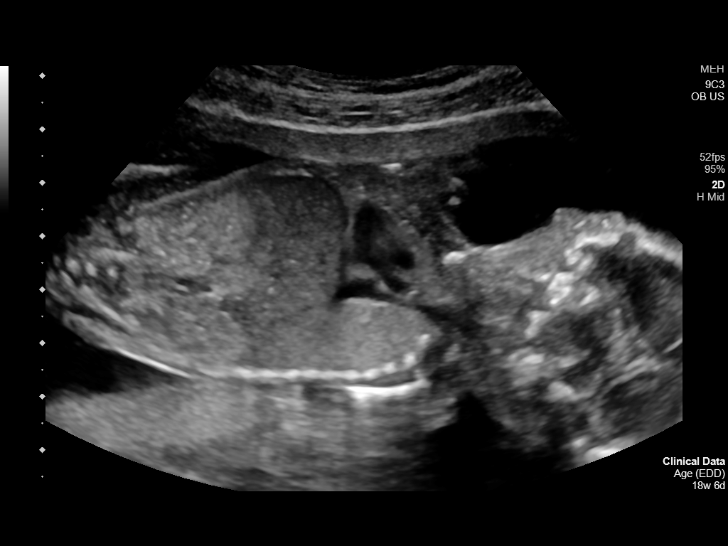
[im 56/137]
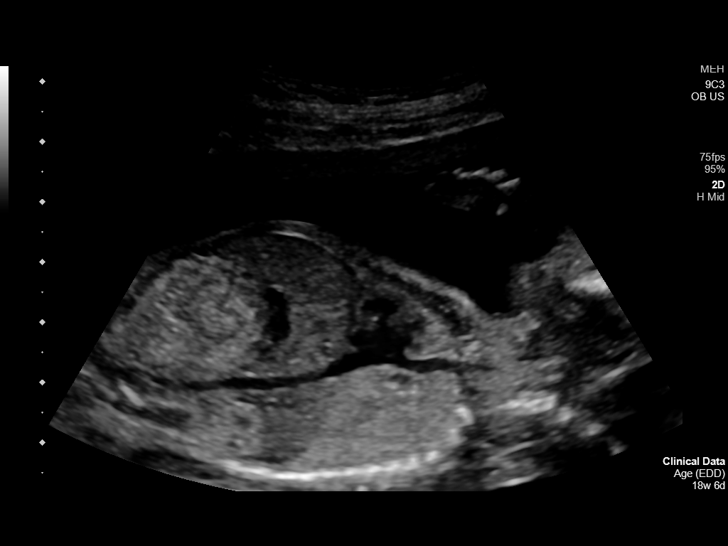
[im 71/137]
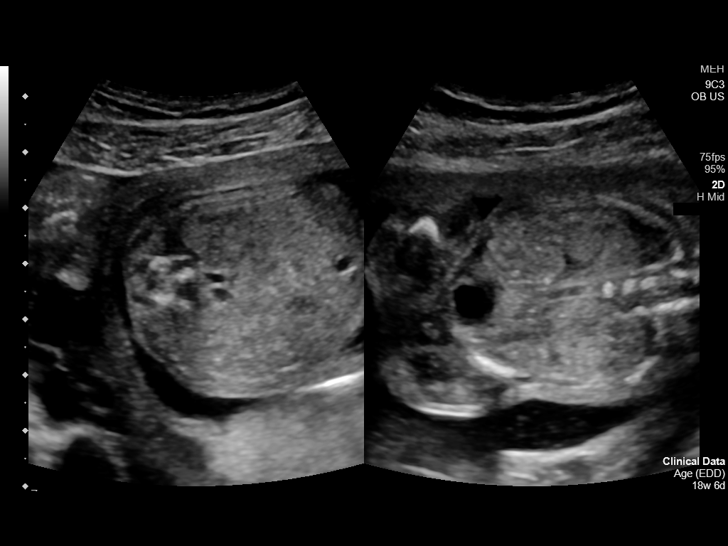
[im 81/137]
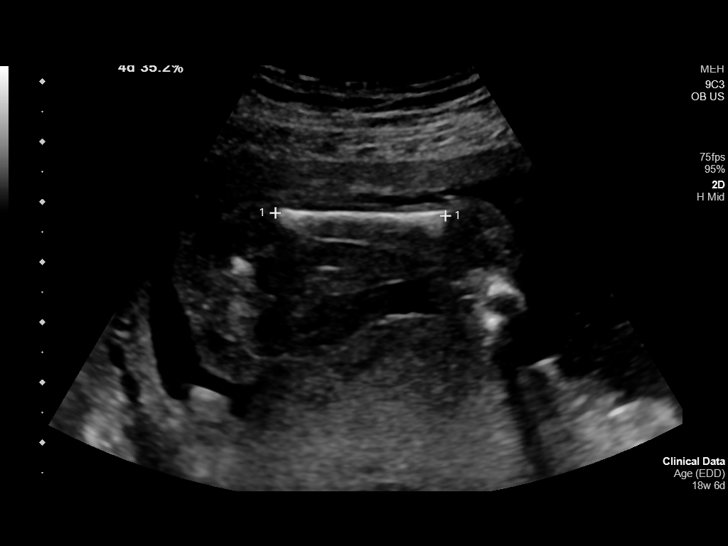
[im 91/137]
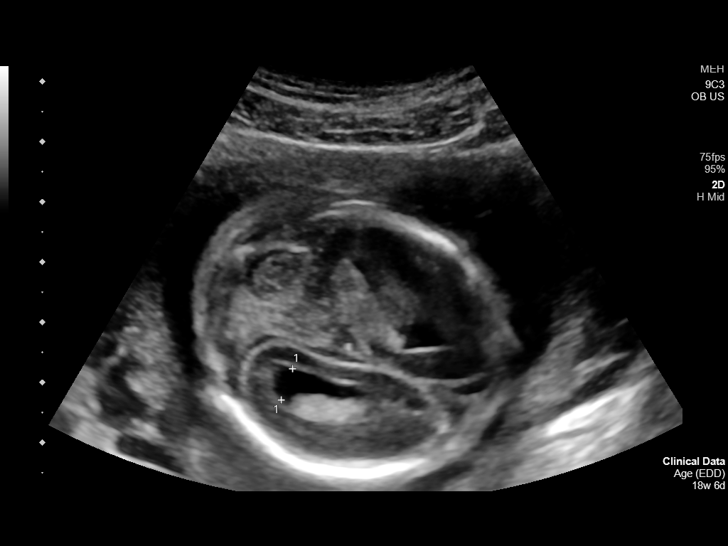
[im 101/137]
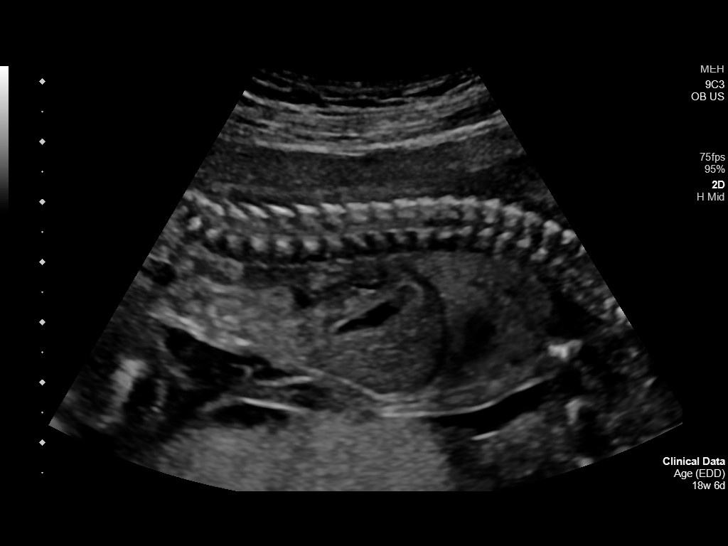
[im 111/137]
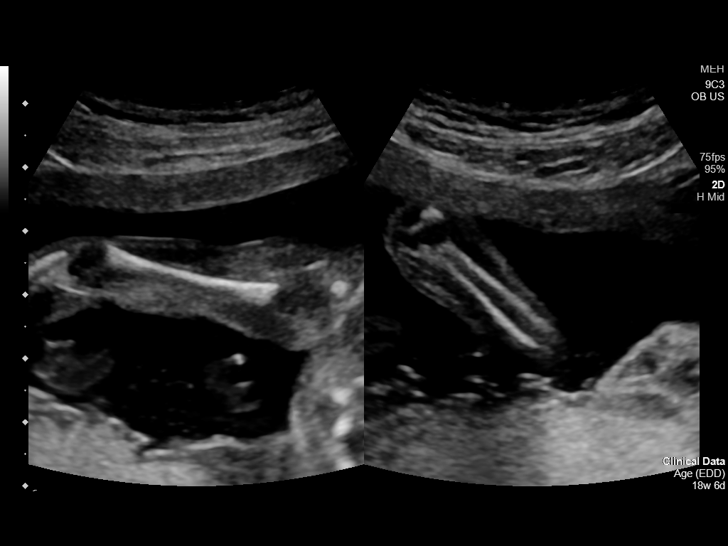
[im 121/137]
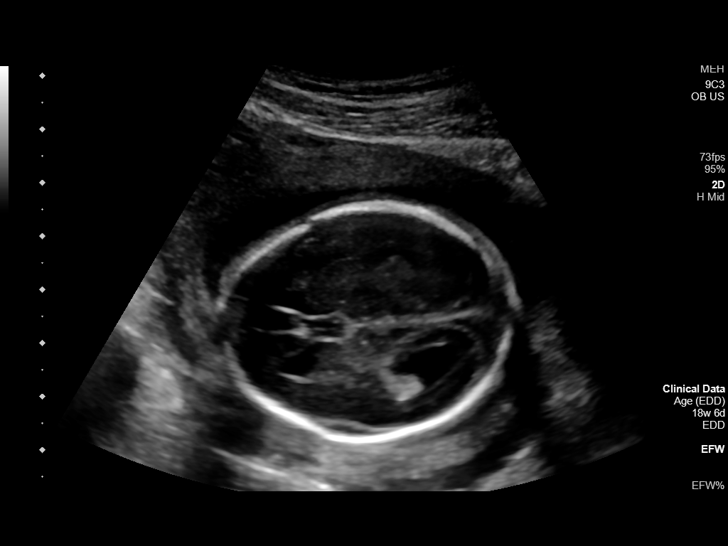
[im 131/137]
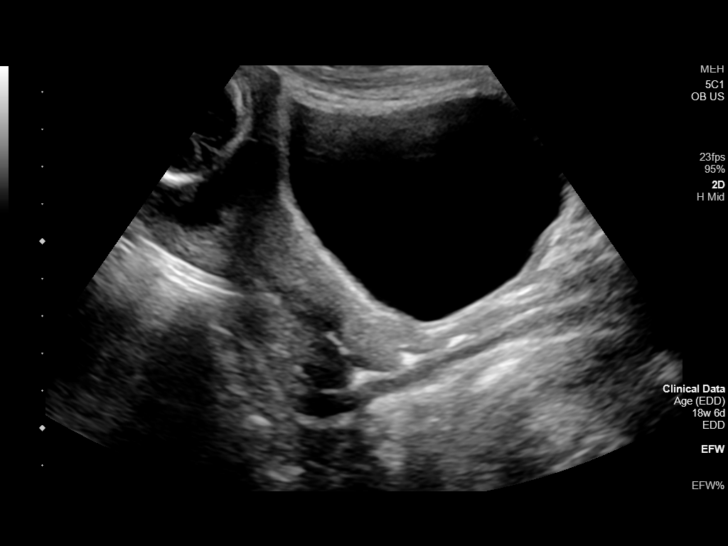

[13 of 28 positions shown; findings below may reference images not displayed]

FINDINGS: Number of Fetuses: 1

Heart Rate:  164 bpm

Movement: Yes

Presentation: Cephalic

Previa: No

Placental Location: Posterior

Amniotic Fluid (Subjective): Normal

Amniotic Fluid (Objective):

Vertical pocket = 3.3cm

FETAL BIOMETRY

BPD: 4.3cm 19w 0d

HC:   16.3cm 19w 0d

AC:   13.8cm 19w 1d

FL:   2.8cm 18w 4d

Current Mean GA: 18w 6d US EDC: 01/11/2022

Assigned GA:  18w 6d Assigned EDC: 01/11/2022

FETAL ANATOMY

Lateral Ventricles: Appears normal

Thalami/CSP: Appears normal

Posterior Fossa:  Appears normal

Nuchal Region: Appears normal   NFT= 3.6 mm

Upper Lip: Appears normal

Spine: Appears normal

4 Chamber Heart on Left: Appears normal

LVOT: Appears normal

RVOT: Appears normal

Stomach on Left: Appears normal

3 Vessel Cord: Appears normal

Cord Insertion site: Appears normal

Kidneys: Appears normal

Bladder: Appears normal

Extremities: Appears normal

Technically difficult due to: N/a

Maternal Findings:

Cervix:  4.5 cm, closed
IMPRESSION: 1. Single live intrauterine pregnancy as above, estimated age 18
weeks and 6 days.
2. Normal fetal anatomic survey.  No fetal anomalies identified.

## 2023-10-24 ENCOUNTER — Encounter: Payer: Self-pay | Admitting: Family Medicine

## 2023-10-24 ENCOUNTER — Ambulatory Visit (INDEPENDENT_AMBULATORY_CARE_PROVIDER_SITE_OTHER)

## 2023-10-24 VITALS — BP 105/65 | HR 75 | Ht 61.0 in | Wt 106.0 lb

## 2023-10-24 DIAGNOSIS — Z3200 Encounter for pregnancy test, result unknown: Secondary | ICD-10-CM

## 2023-10-24 DIAGNOSIS — Z3201 Encounter for pregnancy test, result positive: Secondary | ICD-10-CM

## 2023-10-24 DIAGNOSIS — N912 Amenorrhea, unspecified: Secondary | ICD-10-CM

## 2023-10-24 LAB — POCT URINE PREGNANCY: Preg Test, Ur: POSITIVE — AB

## 2023-10-24 NOTE — Progress Notes (Addendum)
    NURSE VISIT NOTE  Subjective:    Patient ID: Cynthia Jackson, female    DOB: [birth year], 38 y.o.   MRN: [id number]  HPI  Patient is a 38 y.o. [id number] female who presents for evaluation of amenorrhea. She believes she could be pregnant. Pregnancy is desired. Current symptoms also include: breast tenderness, fatigue, morning sickness, and positive home pregnancy test. Last period was normal.    Objective:    BP 105/65   Pulse 75   Ht 5\' 1"  (1.549 m)   Wt 106 lb (48.1 kg)   LMP 09/16/2023   Breastfeeding No   BMI 20.03 kg/m   Lab Review  Results for orders placed or performed in visit on 10/24/23  POCT urine pregnancy  Result Value Ref Range   Preg Test, Ur Positive (A) Negative    Assessment:   1. Possible pregnancy, not confirmed     Plan:   Pregnancy Test: Positive  Estimated Date of Delivery: None noted. Encouraged well-balanced diet, plenty of rest when needed, pre-natal vitamins daily and walking for exercise.  Discussed self-help for nausea, avoiding OTC medications until consulting provider or pharmacist, other than Tylenol  as needed, minimal caffeine (1-2 cups daily) and avoiding alcohol.   She will schedule her nurse visit @ 7-[redacted] wks pregnant, u/s for dating @10  wk, and NOB visit at [redacted] wk pregnant.    Feel free to call with any questions. Pt was able to speak with our student as well.      Kolt Mcwhirter H Orvel Cutsforth, CMA

## 2023-10-30 ENCOUNTER — Encounter: Payer: Self-pay | Admitting: Advanced Practice Midwife

## 2023-10-30 DIAGNOSIS — O99331 Smoking (tobacco) complicating pregnancy, first trimester: Secondary | ICD-10-CM | POA: Diagnosis not present

## 2023-10-30 DIAGNOSIS — R109 Unspecified abdominal pain: Secondary | ICD-10-CM | POA: Diagnosis not present

## 2023-10-30 DIAGNOSIS — O26891 Other specified pregnancy related conditions, first trimester: Secondary | ICD-10-CM | POA: Diagnosis not present

## 2023-10-30 DIAGNOSIS — F17210 Nicotine dependence, cigarettes, uncomplicated: Secondary | ICD-10-CM | POA: Diagnosis not present

## 2023-10-30 DIAGNOSIS — Z3A01 Less than 8 weeks gestation of pregnancy: Secondary | ICD-10-CM | POA: Diagnosis not present

## 2023-10-30 DIAGNOSIS — Z8759 Personal history of other complications of pregnancy, childbirth and the puerperium: Secondary | ICD-10-CM | POA: Diagnosis not present

## 2023-10-30 DIAGNOSIS — M5412 Radiculopathy, cervical region: Secondary | ICD-10-CM | POA: Diagnosis not present

## 2023-10-30 DIAGNOSIS — O99891 Other specified diseases and conditions complicating pregnancy: Secondary | ICD-10-CM | POA: Diagnosis not present

## 2023-11-05 ENCOUNTER — Telehealth

## 2023-11-05 VITALS — Ht 61.0 in | Wt 106.0 lb

## 2023-11-05 DIAGNOSIS — Z3480 Encounter for supervision of other normal pregnancy, unspecified trimester: Secondary | ICD-10-CM

## 2023-11-05 DIAGNOSIS — O3680X0 Pregnancy with inconclusive fetal viability, not applicable or unspecified: Secondary | ICD-10-CM

## 2023-11-05 DIAGNOSIS — Z3A01 Less than 8 weeks gestation of pregnancy: Secondary | ICD-10-CM | POA: Diagnosis not present

## 2023-11-05 DIAGNOSIS — Z3490 Encounter for supervision of normal pregnancy, unspecified, unspecified trimester: Secondary | ICD-10-CM | POA: Insufficient documentation

## 2023-11-05 DIAGNOSIS — Z3481 Encounter for supervision of other normal pregnancy, first trimester: Secondary | ICD-10-CM

## 2023-11-05 NOTE — Patient Instructions (Signed)
 Prenatal Classes offered through [facility] [email or web address]   Doula Website [email or web address]  First Trimester of Pregnancy  The first trimester of pregnancy starts on the first day of your last monthly period until the end of week 13. This is months 1 through 3 of pregnancy. A week after a sperm fertilizes an egg, the egg will implant into the wall of the uterus and begin to develop into a baby. Body changes during your first trimester Your body goes through many changes during pregnancy. The changes usually return to normal after your baby is born. Physical changes Your breasts may grow larger and may hurt. The area around your nipples may get darker. Your periods will stop. Your hair and nails may grow faster. You may pee more often. Health changes You may tire easily. Your gums may bleed and may be sensitive when you brush and floss. You may not feel hungry. You may have heartburn. You may throw up or feel like you may throw up. You may want to eat some foods, but not others. You may have headaches. You may have trouble pooping (constipation). Other changes Your emotions may change from day to day. You may have more dreams. Follow these instructions at home: Medicines Talk to your health care provider if you're taking medicines. Ask if the medicines are safe to take during pregnancy. Your provider may change the medicines that you take. Do not take any medicines unless told to by your provider. Take a prenatal vitamin that has at least 600 micrograms (mcg) of folic acid. Do not use herbal medicines, illegal substances, or medicines that are not approved by your provider. Eating and drinking While you're pregnant your body needs extra food for your growing baby. Talk with your provider about what to eat while pregnant. Activity Most women are able  to exercise during pregnancy. Exercises may need to change as your pregnancy goes on. Talk to your provider about your activities and exercise routines. Relieving pain and discomfort Wear a good, supportive bra if your breasts hurt. Rest with your legs raised if you have leg cramps or low back pain. Safety Wear your seatbelt at all times when you're in a car. Talk to your provider if someone hits you, hurts you, or yells at you. Talk with your provider if you're feeling sad or have thoughts of hurting yourself. Lifestyle Certain things can be harmful while you're pregnant. Follow these rules: Do not use hot tubs, steam rooms, or saunas. Do not douche. Do not use tampons or scented pads. Do not drink alcohol,smoke, vape, or use products with nicotine or tobacco in them. If you need help quitting, talk with your provider. Avoid cat litter boxes and soil used by cats. These things carry germs that can cause harm to your pregnancy and your baby. General instructions Keep all follow-up visits. It helps you and your unborn baby stay as healthy as possible. Write down your questions. Take them to your visits. Your provider will: Talk with you about your overall health. Give you advice or refer you to specialists who can help with different needs, including: Prenatal education classes. Mental health and counseling. Foods and healthy eating. Ask for help if you need help with food. Call your dentist and ask to be seen. Brush your teeth with a soft toothbrush. Floss gently. Where to find more information American Pregnancy Association: americanpregnancy.org [employer]: acog.org Office on [employer] Health: TravelLesson.ca Contact a health care provider if: You feel dizzy, faint, or  have a fever. You vomit or have watery poop (diarrhea) for 2 days or more. You have abnormal discharge or bleeding from your vagina. You have pain when you pee or your pee smells  bad. You have cramps, pain, or pressure in your belly area. Get help right away if: You have trouble breathing or chest pain. You have any kind of injury, such as from a fall or a car crash. These symptoms may be an emergency. Get help right away. Call 911. Do not wait to see if the symptoms will go away. Do not drive yourself to the hospital. This information is not intended to replace advice given to you by your health care provider. Make sure you discuss any questions you have with your health care provider. Document Revised: 03/08/2023 Document Reviewed: 10/06/2022 Elsevier Patient Education  2024 Elsevier Inc.Commonly Asked Questions During Pregnancy  Cats: A parasite can be excreted in cat feces.  To avoid exposure you need to have another person empty the little box.  If you must empty the litter box you will need to wear gloves.  Wash your hands after handling your cat.  This parasite can also be found in raw or undercooked meat so this should also be avoided.  Colds, Sore Throats, Flu: Please check your medication sheet to see what you can take for symptoms.  If your symptoms are unrelieved by these medications please call the office.  Dental Work: Most any dental work Agricultural consultant recommends is permitted.  X-rays should only be taken during the first trimester if absolutely necessary.  Your abdomen should be shielded with a lead apron during all x-rays.  Please notify your provider prior to receiving any x-rays.  Novocaine is fine; gas is not recommended.  If your dentist requires a note from [place] prior to dental work please call the office and we will provide one for you.  Exercise: Exercise is an important part of staying healthy during your pregnancy.  You may continue most exercises you were accustomed to prior to pregnancy.  Later in your pregnancy you will most likely notice you have difficulty with activities requiring balance like riding a bicycle.  It is important that you listen  to your body and avoid activities that put you at a higher risk of falling.  Adequate rest and staying well hydrated are a must!  If you have questions about the safety of specific activities ask your provider.    Exposure to Children with illness: Try to avoid obvious exposure; report any symptoms to [place] when noted,  If you have chicken pos, red measles or mumps, you should be immune to these diseases.   Please do not take any vaccines while pregnant unless you have checked with your OB provider.  Fetal Movement: After 28 weeks we recommend you do "kick counts" twice daily.  Lie or sit down in a calm quiet environment and count your baby movements "kicks".  You should feel your baby at least 10 times per hour.  If you have not felt 10 kicks within the first hour get up, walk around and have something sweet to eat or drink then repeat for an additional hour.  If count remains less than 10 per hour notify your provider.  Fumigating: Follow your pest control agent's advice as to how long to stay out of your home.  Ventilate the area well before re-entering.  Hemorrhoids:   Most over-the-counter preparations can be used during pregnancy.  Check your medication to see  what is safe to use.  It is important to use a stool softener or fiber in your diet and to drink lots of liquids.  If hemorrhoids seem to be getting worse please call the office.   Hot Tubs:  Hot tubs Jacuzzis and saunas are not recommended while pregnant.  These increase your internal body temperature and should be avoided.  Intercourse:  Sexual intercourse is safe during pregnancy as long as you are comfortable, unless otherwise advised by your provider.  Spotting may occur after intercourse; report any bright red bleeding that is heavier than spotting.  Labor:  If you know that you are in labor, please go to the hospital.  If you are unsure, please call the office and let us help you decide what to do.  Lifting, straining, etc:  If your  job requires heavy lifting or straining please check with your provider for any limitations.  Generally, you should not lift items heavier than that you can lift simply with your hands and arms (no back muscles)  Painting:  Paint fumes do not harm your pregnancy, but may make you ill and should be avoided if possible.  Latex or water based paints have less odor than oils.  Use adequate ventilation while painting.  Permanents & Hair Color:  Chemicals in hair dyes are not recommended as they cause increase hair dryness which can increase hair loss during pregnancy.  " Highlighting" and permanents are allowed.  Dye may be absorbed differently and permanents may not hold as well during pregnancy.  Sunbathing:  Use a sunscreen, as skin burns easily during pregnancy.  Drink plenty of fluids; avoid over heating.  Tanning Beds:  Because their possible side effects are still unknown, tanning beds are not recommended.  Ultrasound Scans:  Routine ultrasounds are performed at approximately 20 weeks.  You will be able to see your baby's general anatomy an if you would like to know the gender this can usually be determined as well.  If it is questionable when you conceived you may also receive an ultrasound early in your pregnancy for dating purposes.  Otherwise ultrasound exams are not routinely performed unless there is a medical necessity.  Although you can request a scan we ask that you pay for it when conducted because insurance does not cover " patient request" scans.  Work: If your pregnancy proceeds without complications you may work until your due date, unless your physician or employer advises otherwise.  Round Ligament Pain/Pelvic Discomfort:  Sharp, shooting pains not associated with bleeding are fairly common, usually occurring in the second trimester of pregnancy.  They tend to be worse when standing up or when you remain standing for long periods of time.  These are the result of pressure of certain  pelvic ligaments called "round ligaments".  Rest, Tylenol and heat seem to be the most effective relief.  As the womb and fetus grow, they rise out of the pelvis and the discomfort improves.  Please notify the office if your pain seems different than that described.  It may represent a more serious condition.  Common Medications Safe in Pregnancy  Acne:      Constipation:  Benzoyl Peroxide     Colace  Clindamycin      Dulcolax Suppository  Topica Erythromycin     Fibercon  Salicylic Acid      Metamucil         Miralax AVOID:        Senakot   Accutane  Cough:  Retin-A       Cough Drops  Tetracycline      Phenergan w/ Codeine if Rx  Minocycline      Robitussin (Plain & DM)  Antibiotics:     Crabs/Lice:  Ceclor       RID  Cephalosporins    AVOID:  E-Mycins      Kwell  Keflex  Macrobid/Macrodantin   Diarrhea:  Penicillin      Kao-Pectate  Zithromax      Imodium AD         PUSH FLUIDS AVOID:       Cipro     Fever:  Tetracycline      Tylenol (Regular or Extra  Minocycline       Strength)  Levaquin      Extra Strength-Do not          Exceed 8 tabs/24 hrs Caffeine:        200mg /day (equiv. To 1 cup of coffee or  approx. 3 12 oz sodas)         Gas: Cold/Hayfever:       Gas-X  Benadryl      Mylicon  Claritin       Phazyme  **Claritin-D        Chlor-Trimeton    Headaches:  Dimetapp      ASA-Free Excedrin  Drixoral-Non-Drowsy     Cold Compress  Mucinex (Guaifenasin)     Tylenol (Regular or Extra  Sudafed/Sudafed-12 Hour     Strength)  **Sudafed PE Pseudoephedrine   Tylenol Cold & Sinus     Vicks Vapor Rub  Zyrtec  **AVOID if Problems With Blood Pressure         Heartburn: Avoid lying down for at least 1 hour after meals  Aciphex      Maalox     Rash:  Milk of Magnesia     Benadryl    Mylanta       1% Hydrocortisone Cream  Pepcid  Pepcid Complete   Sleep Aids:  Prevacid      Ambien   Prilosec       Benadryl  Rolaids       Chamomile Tea  Tums (Limit  4/day)     Unisom         Tylenol PM         Warm milk-add vanilla or  Hemorrhoids:       Sugar for taste  Anusol/Anusol H.C.  (RX: Analapram 2.5%)  Sugar Substitutes:  Hydrocortisone OTC     Ok in moderation  Preparation H      Tucks        Vaseline lotion applied to tissue with wiping    Herpes:     Throat:  Acyclovir      Oragel  Famvir  Valtrex     Vaccines:         Flu Shot Leg Cramps:       *Gardasil  Benadryl      Hepatitis A         Hepatitis B Nasal Spray:       Pneumovax  Saline Nasal Spray     Polio Booster         Tetanus Nausea:       Tuberculosis test or PPD  Vitamin B6 25 mg TID   AVOID:    Dramamine      *Gardasil  Emetrol       Live Poliovirus  Ginger Root 250 mg  QID    MMR (measles, mumps &  High Complex Carbs @ Bedtime    rebella)  Sea Bands-Accupressure    Varicella (Chickenpox)  Unisom 1/2 tab TID     *No known complications           If received before Pain:         Known pregnancy;   Darvocet       Resume series after  Lortab        Delivery  Percocet    Yeast:   Tramadol      Femstat  Tylenol 3      Gyne-lotrimin  Ultram       Monistat  Vicodin           MISC:         All Sunscreens           Hair Coloring/highlights          Insect Repellant's          (Including DEET)         Mystic Tans

## 2023-11-05 NOTE — Progress Notes (Signed)
 New OB Intake  I connected with  Cynthia Jackson on 11/05/23 at 10:15 AM EDT by MyChart Video Visit and verified that I am speaking with the correct person using two identifiers. Nurse is located at [facility] and pt is located at home.   I discussed the limitations, risks, security and privacy concerns of performing an evaluation and management service by telephone and the availability of in person appointments. I also discussed with the patient that there may be a patient responsible charge related to this service. The patient expressed understanding and agreed to proceed.  I explained I am completing New OB Intake today. We discussed her EDD of 06/22/24 that is based on LMP of 09/16/23. Pt is G5/P3013. I reviewed her allergies, medications, Medical/Surgical/OB history, and appropriate screenings. There are cats in the home in the home ; NO. Based on history, this is a/an pregnancy uncomplicated . Her obstetrical history is significant for advanced maternal age and smoker.  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 Dating US  scheduled for 11/26/23 at 4pm. Pt notified to arrive at 3:45.  Labs Discussed genetic screening with patient. Patient wants genetic testing to be drawn at new OB visit. Discussed possible labs to be drawn  at new OB appointment.  COVID Vaccine Patient has not had COVID vaccine.   Social Determinants of Health Food Insecurity: denies food insecurity WIC Referral: Patient is not interested in referral to [facility].  Transportation: Patient denies transportation needs. Childcare: Discussed no children allowed at ultrasound appointments.   First visit review I reviewed new OB appt with pt. I explained she will have ob bloodwork and pap smear/pelvic exam if indicated. Explained pt will be seen by Josue Nip, CNM at first visit; encounter routed to appropriate provider.   Vale Garrison, CMA 11/05/2023  10:49 AM'

## 2023-11-20 ENCOUNTER — Telehealth: Payer: Self-pay

## 2023-11-20 DIAGNOSIS — O219 Vomiting of pregnancy, unspecified: Secondary | ICD-10-CM

## 2023-11-20 MED ORDER — ONDANSETRON 4 MG PO TBDP: 4.0000 mg | ORAL_TABLET | Freq: Three times a day (TID) | ORAL | 1 refills | Status: DC | PRN

## 2023-11-20 NOTE — Telephone Encounter (Signed)
 Patient called she is [redacted]w[redacted]d and she has been having some morning sickness. I sent in a prescription of Zofran .

## 2023-11-26 ENCOUNTER — Other Ambulatory Visit: Payer: Self-pay | Admitting: Obstetrics

## 2023-11-26 ENCOUNTER — Ambulatory Visit
Admission: RE | Admit: 2023-11-26 | Discharge: 2023-11-26 | Disposition: A | Discharge status: Home / Self Care | Source: Ambulatory Visit | Attending: Obstetrics | Admitting: Obstetrics

## 2023-11-26 DIAGNOSIS — O3680X0 Pregnancy with inconclusive fetal viability, not applicable or unspecified: Secondary | ICD-10-CM | POA: Diagnosis not present

## 2023-11-26 DIAGNOSIS — Z3480 Encounter for supervision of other normal pregnancy, unspecified trimester: Secondary | ICD-10-CM

## 2023-11-26 DIAGNOSIS — Z3689 Encounter for other specified antenatal screening: Secondary | ICD-10-CM | POA: Insufficient documentation

## 2023-11-26 DIAGNOSIS — Z3A01 Less than 8 weeks gestation of pregnancy: Secondary | ICD-10-CM

## 2023-11-26 DIAGNOSIS — Z3A09 9 weeks gestation of pregnancy: Secondary | ICD-10-CM | POA: Insufficient documentation

## 2023-11-26 DIAGNOSIS — Z3490 Encounter for supervision of normal pregnancy, unspecified, unspecified trimester: Secondary | ICD-10-CM

## 2023-11-27 ENCOUNTER — Telehealth: Payer: Self-pay

## 2023-11-27 NOTE — Telephone Encounter (Signed)
 TRIAGE VOICEMAIL: Patient inquiring about u/s results from yesterday. She is concerned about Impressions #2. Requesting return call to discuss.

## 2023-11-28 ENCOUNTER — Telehealth: Payer: Self-pay | Admitting: Obstetrics

## 2023-11-28 NOTE — Telephone Encounter (Signed)
 Reviewed Zelda's US  results with Dr. Grayland Le - no need for f/u at this time. Consider MFM f/u after anatomy scan if prominent vessels are concerning. Discussed this with [place].  Marleni Gallardo M Jadalyn Oliveri, CNM

## 2023-12-05 ENCOUNTER — Encounter: Payer: Self-pay | Admitting: Obstetrics

## 2023-12-05 ENCOUNTER — Other Ambulatory Visit (HOSPITAL_COMMUNITY)
Admission: RE | Admit: 2023-12-05 | Discharge: 2023-12-05 | Disposition: A | Discharge status: Home / Self Care | Source: Ambulatory Visit | Attending: Obstetrics | Admitting: Obstetrics

## 2023-12-05 ENCOUNTER — Ambulatory Visit (INDEPENDENT_AMBULATORY_CARE_PROVIDER_SITE_OTHER): Admitting: Obstetrics

## 2023-12-05 VITALS — BP 108/68 | HR 73 | Wt 109.0 lb

## 2023-12-05 DIAGNOSIS — D649 Anemia, unspecified: Secondary | ICD-10-CM

## 2023-12-05 DIAGNOSIS — Z1379 Encounter for other screening for genetic and chromosomal anomalies: Secondary | ICD-10-CM | POA: Diagnosis not present

## 2023-12-05 DIAGNOSIS — Z0283 Encounter for blood-alcohol and blood-drug test: Secondary | ICD-10-CM

## 2023-12-05 DIAGNOSIS — Z0184 Encounter for antibody response examination: Secondary | ICD-10-CM | POA: Diagnosis not present

## 2023-12-05 DIAGNOSIS — Z3A11 11 weeks gestation of pregnancy: Secondary | ICD-10-CM | POA: Diagnosis not present

## 2023-12-05 DIAGNOSIS — Z113 Encounter for screening for infections with a predominantly sexual mode of transmission: Secondary | ICD-10-CM | POA: Insufficient documentation

## 2023-12-05 DIAGNOSIS — Z131 Encounter for screening for diabetes mellitus: Secondary | ICD-10-CM | POA: Insufficient documentation

## 2023-12-05 DIAGNOSIS — O99011 Anemia complicating pregnancy, first trimester: Secondary | ICD-10-CM | POA: Insufficient documentation

## 2023-12-05 DIAGNOSIS — Z13220 Encounter for screening for lipoid disorders: Secondary | ICD-10-CM | POA: Diagnosis not present

## 2023-12-05 DIAGNOSIS — O09891 Supervision of other high risk pregnancies, first trimester: Secondary | ICD-10-CM | POA: Diagnosis not present

## 2023-12-05 DIAGNOSIS — Z3009 Encounter for other general counseling and advice on contraception: Secondary | ICD-10-CM | POA: Insufficient documentation

## 2023-12-05 DIAGNOSIS — R350 Frequency of micturition: Secondary | ICD-10-CM | POA: Diagnosis not present

## 2023-12-05 MED ORDER — NICOTINE POLACRILEX 2 MG MT GUM: 2.0000 mg | CHEWING_GUM | OROMUCOSAL | 0 refills | Status: DC | PRN

## 2023-12-05 MED ORDER — NICOTINE POLACRILEX 2 MG MT GUM: 2.0000 mg | CHEWING_GUM | OROMUCOSAL | Status: DC | PRN

## 2023-12-05 NOTE — Progress Notes (Addendum)
 NEW OB HISTORY AND PHYSICAL  SUBJECTIVE:       Cynthia Jackson is a 38 y.o. [phone] female, Patient's last menstrual period was 09/16/2023., Estimated Date of Delivery: 06/22/24, [redacted]w[redacted]d, presents today for establishment of Prenatal Care. She reports nausea. She has been feeling increased anxiety, especially after having an ectopic pregnancy.  Social history Partner/Relationship: married Living situation: lives with husband and kids. Feels safe there. Work: [employer] Exercise: walking, stays active with kids Substance use: smokes 2 cigs day, MJ use   Gynecologic History Patient's last menstrual period was 09/16/2023. Normal Contraception: none Last Pap: 12/21/2021. Results were: normal. Needs 3-year f/u post-colposocopy  Obstetric History OB History  Gravida Para Term Preterm AB Living  5 3 3  0 1 3  SAB IAB Ectopic Multiple Live Births    1 0 3    # Outcome Date GA Lbr Len/2nd Weight Sex Type Anes PTL Lv  5 Current           4 Term 01/12/22 [redacted]w[redacted]d / 00:11 7 lb 3.3 oz (3.27 kg) F Vag-Spont EPI  LIV  3 Ectopic 2022          2 Term 2012 [redacted]w[redacted]d  6 lb 12 oz (3.062 kg) M Vag-Spont EPI  LIV  1 Term 2008   8 lb 9 oz (3.884 kg) F Vag-Spont EPI  LIV    History reviewed. No pertinent past medical history.  Past Surgical History:  Procedure Laterality Date   KNEE SURGERY Left    MULTIPLE TOOTH EXTRACTIONS  2024    Current Outpatient Medications on File Prior to Visit  Medication Sig Dispense Refill   ondansetron  (ZOFRAN -ODT) 4 MG disintegrating tablet Take 1 tablet (4 mg total) by mouth every 8 (eight) hours as needed for nausea or vomiting. 20 tablet 1   prenatal vitamin w/FE, FA (PRENATAL 1 + 1) 27-1 MG TABS tablet Take 1 tablet by mouth daily at 12 noon.     No current facility-administered medications on file prior to visit.    Allergies  Allergen Reactions   Other Anaphylaxis    Fresh and vegetables allergy    Social History   Socioeconomic History   Marital status: Married     Spouse name: Not on file   Number of children: Not on file   Years of education: Not on file   Highest education level: Not on file  Occupational History   Not on file  Tobacco Use   Smoking status: Former    Current packs/day: 0.00    Average packs/day: 0.5 packs/day for 32.0 years (16.0 ttl pk-yrs)    Types: Cigarettes    Start date: 01/28/1989    Quit date: 01/28/2021    Years since quitting: 2.8   Smokeless tobacco: Never  Vaping Use   Vaping status: Never Used  Substance and Sexual Activity   Alcohol use: Never   Drug use: Yes    Types: Marijuana    Comment: currently once a day   Sexual activity: Yes    Birth control/protection: None  Other Topics Concern   Not on file  Social History Narrative   Not on file   Social Drivers of Health   Financial Resource Strain: Not on file  Food Insecurity: No Food Insecurity (11/05/2023)   Hunger Vital Sign    Worried About Running Out of Food in the Last Year: Never true    Ran Out of Food in the Last Year: Never true  Transportation Needs: No Transportation Needs (  11/05/2023)   PRAPARE - Administrator, Civil Service (Medical): No    Lack of Transportation (Non-Medical): No  Physical Activity: Not on file  Stress: Not on file  Social Connections: Not on file  Intimate Partner Violence: Not At Risk (11/05/2023)   Humiliation, Afraid, Rape, and Kick questionnaire    Fear of Current or Ex-Partner: No    Emotionally Abused: No    Physically Abused: No    Sexually Abused: No    Family History  Problem Relation Age of Onset   Breast cancer Maternal Aunt    Breast cancer Maternal Grandmother     The following portions of the patient's history were reviewed and updated as appropriate: allergies, current medications, past OB history, past medical history, past surgical history, past family history, past social history, and problem list.  Constitutional: Denied constitutional symptoms, night sweats, recent  illness, fatigue, fever, insomnia and weight loss.  Eyes: Denied eye symptoms, eye pain, photophobia, vision change and visual disturbance.  Ears/Nose/Throat/Neck: Denied ear, nose, throat or neck symptoms, hearing loss, nasal discharge, sinus congestion and sore throat.  Cardiovascular: Denied cardiovascular symptoms, arrhythmia, chest pain/pressure, edema, exercise intolerance, orthopnea and palpitations.  Respiratory: Denied pulmonary symptoms, asthma, pleuritic pain, productive sputum, cough, dyspnea and wheezing.  Gastrointestinal: Denied, gastro-esophageal reflux, melena, nausea and vomiting.  Genitourinary: Denied genitourinary symptoms including symptomatic vaginal discharge, pelvic relaxation issues, and urinary complaints.  Musculoskeletal: Denied musculoskeletal symptoms, stiffness, swelling, muscle weakness and myalgia.  Dermatologic: Denied dermatology symptoms, rash and scar.  Neurologic: Denied neurology symptoms, dizziness, headache, neck pain and syncope.  Psychiatric: Denied psychiatric symptoms, anxiety and depression.  Endocrine: Denied endocrine symptoms including hot flashes and night sweats.    Indications for ASA therapy (per uptodate) One of the following: Previous pregnancy with preeclampsia, especially early onset and with an adverse outcome No Multifetal gestation No Chronic hypertension No Type 1 or 2 diabetes mellitus No Chronic kidney disease No Autoimmune disease (antiphospholipid syndrome, systemic lupus erythematosus) No  Two or more of the following: Nulliparity No Obesity (body mass index >30 kg/m2) No Family history of preeclampsia in mother or sister No Age >=35 years Yes Sociodemographic characteristics (African American race, low socioeconomic level) No Personal risk factors (eg, previous pregnancy with low birth weight or small for gestational age infant, previous adverse pregnancy outcome [eg, stillbirth], interval >10 years between pregnancies)  No   OBJECTIVE: Initial Physical Exam (New OB)  GENERAL APPEARANCE: alert, well appearing HEAD: normocephalic, atraumatic MOUTH: mucous membranes moist, pharynx normal without lesions THYROID: no thyromegaly or masses present BREASTS: patient declined exam LUNGS: clear to auscultation, no wheezes, rales or rhonchi, symmetric air entry HEART: regular rate and rhythm, no murmurs ABDOMEN: soft, nontender, nondistended, no abnormal masses, no epigastric pain and FHT present EXTREMITIES: no redness or tenderness in the calves or thighs SKIN: normal coloration and turgor, no rashes LYMPH NODES: no adenopathy palpable NEUROLOGIC: alert, oriented, normal speech, no focal findings or movement disorder noted  PELVIC EXAM declined  ASSESSMENT: Normal pregnancy [redacted]w[redacted]d   PLAN: Routine prenatal care. We discussed an overview of prenatal care and when to call. Reviewed diet, exercise, and weight gain recommendations in pregnancy. Discussed benefits of breastfeeding and lactation resources at [facility]. I answered all questions. Labs and genetic screening today. Referral sent to counselor. [place] is trying to stop smoking and would like to try nicotine gum. Rx sent. She will be having the rest of her teeth removed soon. Dental note given.  See orders  Saroya Riccobono, CNM

## 2023-12-06 ENCOUNTER — Telehealth: Payer: Self-pay | Admitting: Licensed Clinical Social Worker

## 2023-12-06 LAB — URINALYSIS, ROUTINE W REFLEX MICROSCOPIC
Bilirubin, UA: NEGATIVE
Glucose, UA: NEGATIVE
Ketones, UA: NEGATIVE
Leukocytes,UA: NEGATIVE
Nitrite, UA: NEGATIVE
Protein,UA: NEGATIVE
RBC, UA: NEGATIVE
Specific Gravity, UA: 1.01 (ref 1.005–1.030)
Urobilinogen, Ur: 0.2 mg/dL (ref 0.2–1.0)
pH, UA: 8.5 — ABNORMAL HIGH (ref 5.0–7.5)

## 2023-12-06 LAB — CERVICOVAGINAL ANCILLARY ONLY
Chlamydia: NEGATIVE
Comment: NEGATIVE
Comment: NORMAL
Neisseria Gonorrhea: NEGATIVE

## 2023-12-06 NOTE — Telephone Encounter (Signed)
-----   Message from Phylliss Brenner sent at 12/05/2023  4:40 PM EDT ----- Regarding: Referral for counseling Hi Mylinda Asa,  I saw [place] for a NOB visit today, and she is interested in counseling for anxiety. Are you able to connect with her?  Thank you!  Missy

## 2023-12-07 LAB — CBC/D/PLT+RPR+RH+ABO+RUBIGG...
Antibody Screen: NEGATIVE
Basophils Absolute: 0 10*3/uL (ref 0.0–0.2)
Basos: 0 %
EOS (ABSOLUTE): 0.1 10*3/uL (ref 0.0–0.4)
Eos: 1 %
HCV Ab: NONREACTIVE
HIV Screen 4th Generation wRfx: NONREACTIVE
Hematocrit: 37.9 % (ref 34.0–46.6)
Hemoglobin: 12.5 g/dL (ref 11.1–15.9)
Hepatitis B Surface Ag: NEGATIVE
Immature Grans (Abs): 0 10*3/uL (ref 0.0–0.1)
Immature Granulocytes: 0 %
Lymphocytes Absolute: 1.3 10*3/uL (ref 0.7–3.1)
Lymphs: 18 %
MCH: 31.4 pg (ref 26.6–33.0)
MCHC: 33 g/dL (ref 31.5–35.7)
MCV: 95 fL (ref 79–97)
Monocytes Absolute: 0.5 10*3/uL (ref 0.1–0.9)
Monocytes: 6 %
Neutrophils Absolute: 5.5 10*3/uL (ref 1.4–7.0)
Neutrophils: 74 %
Platelets: 188 10*3/uL (ref 150–450)
RBC: 3.98 x10E6/uL (ref 3.77–5.28)
RDW: 12.9 % (ref 11.7–15.4)
RPR Ser Ql: NONREACTIVE
Rh Factor: POSITIVE
Rubella Antibodies, IGG: 1.04 {index} (ref >0.99)
Varicella zoster IgG: REACTIVE
WBC: 7.4 10*3/uL (ref 3.4–10.8)

## 2023-12-07 LAB — URINE CULTURE, OB REFLEX: Organism ID, Bacteria: NO GROWTH

## 2023-12-07 LAB — HCV INTERPRETATION

## 2023-12-07 LAB — HEMOGLOBIN A1C
Est. average glucose Bld gHb Est-mCnc: 103 mg/dL
Hgb A1c MFr Bld: 5.2 % (ref 4.8–5.6)

## 2023-12-07 LAB — CULTURE, OB URINE

## 2023-12-09 LAB — MONITOR DRUG PROFILE 14(MW)
Amphetamine Scrn, Ur: NEGATIVE ng/mL
BARBITURATE SCREEN URINE: NEGATIVE ng/mL
BENZODIAZEPINE SCREEN, URINE: NEGATIVE ng/mL
Buprenorphine, Urine: NEGATIVE ng/mL
Cocaine (Metab) Scrn, Ur: NEGATIVE ng/mL
Creatinine(Crt), U: 30.1 mg/dL (ref 20.0–300.0)
Fentanyl, Urine: NEGATIVE pg/mL
Meperidine Screen, Urine: NEGATIVE ng/mL
Methadone Screen, Urine: NEGATIVE ng/mL
OXYCODONE+OXYMORPHONE UR QL SCN: NEGATIVE ng/mL
Opiate Scrn, Ur: NEGATIVE ng/mL
Ph of Urine: 8.1 (ref 4.5–8.9)
Phencyclidine Qn, Ur: NEGATIVE ng/mL
Propoxyphene Scrn, Ur: NEGATIVE ng/mL
SPECIFIC GRAVITY: 1.009
Tramadol Screen, Urine: NEGATIVE ng/mL

## 2023-12-09 LAB — CANNABINOID (GC/MS), URINE
Cannabinoid: POSITIVE — AB
Carboxy THC (GC/MS): 506 ng/mL

## 2023-12-09 LAB — NICOTINE SCREEN, URINE: Cotinine Ql Scrn, Ur: POSITIVE ng/mL — AB

## 2023-12-10 LAB — MATERNIT 21 PLUS CORE, BLOOD
Fetal Fraction: 17
Result (T21): NEGATIVE
Trisomy 13 (Patau syndrome): NEGATIVE
Trisomy 18 (Edwards syndrome): NEGATIVE
Trisomy 21 (Down syndrome): NEGATIVE

## 2023-12-20 ENCOUNTER — Ambulatory Visit: Payer: Self-pay | Admitting: Licensed Clinical Social Worker

## 2024-01-02 ENCOUNTER — Ambulatory Visit (INDEPENDENT_AMBULATORY_CARE_PROVIDER_SITE_OTHER): Admitting: Obstetrics

## 2024-01-02 ENCOUNTER — Encounter: Payer: Self-pay | Admitting: Obstetrics

## 2024-01-02 VITALS — BP 107/64 | HR 75 | Wt 117.0 lb

## 2024-01-02 DIAGNOSIS — Z1379 Encounter for other screening for genetic and chromosomal anomalies: Secondary | ICD-10-CM

## 2024-01-02 DIAGNOSIS — Z3A15 15 weeks gestation of pregnancy: Secondary | ICD-10-CM

## 2024-01-02 DIAGNOSIS — Z3689 Encounter for other specified antenatal screening: Secondary | ICD-10-CM

## 2024-01-02 NOTE — Assessment & Plan Note (Addendum)
-  Anatomy US  ordered -Discussed AFP at next visit -Reviewed safety of amoxicillin in pregnancy if indicated and risks of periodontal infection in pregnancy -Anticipatory guidance about second trimester and fetal development

## 2024-01-02 NOTE — Progress Notes (Signed)
    Return Prenatal Note   Assessment/Plan   Plan  38 y.o. [id number] at [redacted]w[redacted]d presents for follow-up OB visit. Reviewed prenatal record including previous visit note.  Supervision of low-risk pregnancy -Anatomy US  ordered -Discussed AFP at next visit -Reviewed safety of amoxicillin in pregnancy if indicated and risks of periodontal infection in pregnancy -Anticipatory guidance about second trimester and fetal development   Orders Placed This Encounter  Procedures   US  OB Comp + 14 Wk    Standing Status:   Future    Expected Date:   01/16/2024    Expiration Date:   01/01/2025    Reason for Exam (SYMPTOM  OR DIAGNOSIS REQUIRED):   anantomy    Preferred Imaging Location?:   Internal   AFP, Serum, Open Spina Bifida    Standing Status:   Future    Expiration Date:   01/01/2025    Is patient insulin dependent?:   No    Gestational Age (GA), weeks:   15    Date on which patient was at this GA:   12/30/2023    GA Calculation Method:   LMP    Number of fetuses:   1   Return in about 4 weeks (around 01/30/2024).   Future Appointments  Date Time Provider Department Center  01/31/2024 10:15 AM [facility] US  1 [facility]G None  01/31/2024 11:15 AM Justino Eleanor HERO, CNM [facility] None    For next visit:  Routine prenatal care    Subjective   Cynthia Jackson is looking forward to having her dental surgery next month! She reports that dentist said she cannot have any more antibiotics if she gets another infection before her surgery.  Movement: Absent Contractions: Not present  Objective   Flow sheet Vitals: Pulse Rate: 75 BP: 107/64 Fetal Heart Rate (bpm): 145 Total weight gain: 17 lb (7.711 kg)  General Appearance  No acute distress, well appearing, and well nourished Pulmonary   Normal work of breathing Neurologic   Alert and oriented to person, place, and time Psychiatric   Mood and affect within normal limits  Eleanor Justino, CNM 01/02/24 10:33 AM

## 2024-01-31 ENCOUNTER — Encounter: Payer: Self-pay | Admitting: Obstetrics

## 2024-01-31 ENCOUNTER — Ambulatory Visit

## 2024-01-31 ENCOUNTER — Ambulatory Visit (INDEPENDENT_AMBULATORY_CARE_PROVIDER_SITE_OTHER): Admitting: Obstetrics

## 2024-01-31 VITALS — BP 111/64 | HR 77 | Wt 119.8 lb

## 2024-01-31 DIAGNOSIS — Z3A19 19 weeks gestation of pregnancy: Secondary | ICD-10-CM

## 2024-01-31 DIAGNOSIS — Z3482 Encounter for supervision of other normal pregnancy, second trimester: Secondary | ICD-10-CM

## 2024-01-31 DIAGNOSIS — Z3689 Encounter for other specified antenatal screening: Secondary | ICD-10-CM

## 2024-01-31 DIAGNOSIS — F419 Anxiety disorder, unspecified: Secondary | ICD-10-CM

## 2024-01-31 DIAGNOSIS — Z3480 Encounter for supervision of other normal pregnancy, unspecified trimester: Secondary | ICD-10-CM

## 2024-01-31 DIAGNOSIS — O99320 Drug use complicating pregnancy, unspecified trimester: Secondary | ICD-10-CM

## 2024-01-31 DIAGNOSIS — Z3A15 15 weeks gestation of pregnancy: Secondary | ICD-10-CM

## 2024-01-31 NOTE — Assessment & Plan Note (Signed)
-  Anatomy US  today. Report not yet available -Encouraged smoking cessation efforts -Discussed BTL timing and post-op expectations -Anticipatory guidance about second trimester and fetal development -Reviewed kick counts and preterm labor warning signs. Instructed to call office or come to hospital with persistent headache, vision changes, regular contractions, leaking of fluid, or vaginal bleeding.

## 2024-01-31 NOTE — Progress Notes (Signed)
    Return Prenatal Note   Assessment/Plan   Plan  38 y.o. [id number] at [redacted]w[redacted]d presents for follow-up OB visit. Reviewed prenatal record including previous visit note.  Supervision of other normal pregnancy, antepartum -Anatomy US  today. Report not yet available -Encouraged smoking cessation efforts -Discussed BTL timing and post-op expectations -Anticipatory guidance about second trimester and fetal development -Reviewed kick counts and preterm labor warning signs. Instructed to call office or come to hospital with persistent headache, vision changes, regular contractions, leaking of fluid, or vaginal bleeding.      No orders of the defined types were placed in this encounter.  Return in about 4 weeks (around 02/28/2024).   Future Appointments  Date Time Provider Department Center  02/28/2024  9:55 AM Slaughterbeck, Damien, CNM [facility] None    For next visit:  Routine prenatal care    Subjective   Cynthia Jackson had her oral surgery done and is doing well. She has been cutting back on smoking - her husband has quit drinking and they are supporting each other's healthy habits. She is considering a BTL and has questions about this.   Movement: Present Contractions: Not present  Objective   Flow sheet Vitals: Pulse Rate: 77 BP: 111/64 Fundal Height: 19 cm Fetal Heart Rate (bpm): 155 Total weight gain: 19 lb 12.8 oz (8.981 kg)  General Appearance  No acute distress, well appearing, and well nourished Pulmonary   Normal work of breathing Neurologic   Alert and oriented to person, place, and time Psychiatric   Mood and affect within normal limits  Eleanor Canny, CNM 01/31/24 11:45 AM

## 2024-02-26 NOTE — Progress Notes (Unsigned)
    Return Prenatal Note   Subjective   38 y.o. [id number] at [redacted]w[redacted]d presents for this follow-up prenatal visit.  Patient is doing well. She reports that when she coughs or sneezes she has a weird feeling in her abdomen, but it is not painful. Patient reports: Movement: Present Contractions: Not present  Objective   Flow sheet Vitals: Pulse Rate: 83 BP: (!) 104/42 Total weight gain: 29 lb 9.6 oz (13.4 kg)  General Appearance  No acute distress, well appearing, and well nourished Pulmonary   Normal work of breathing Neurologic   Alert and oriented to person, place, and time Psychiatric   Mood and affect within normal limits   Assessment/Plan   Plan  38 y.o. [id number] at [redacted]w[redacted]d presents for follow-up OB visit. Reviewed prenatal record including previous visit note.  Supervision of other normal pregnancy, antepartum Some suprapubic discomfort when sneezing or coughing. Comfort measures reviewed. Reviewed 28 week labs for next visit.  Reviewed red flag warning signs       Orders Placed This Encounter  Procedures   28 Week RH+Panel    Standing Status:   Future    Expected Date:   03/25/2024    Expiration Date:   06/23/2024   No follow-ups on file.   Future Appointments  Date Time Provider Department Center  03/27/2024  9:00 AM [facility] LAB [facility] None  03/27/2024  9:35 AM Lynda Bradley, CNM [facility] None     For next visit:  ROB with 1 hour glucola, third trimester labs, and Tdap , Medicaid form.     Damien Parsley, CNM [facility] 09/11/253:50 PM

## 2024-02-28 ENCOUNTER — Encounter: Payer: Self-pay | Admitting: Certified Nurse Midwife

## 2024-02-28 ENCOUNTER — Ambulatory Visit (INDEPENDENT_AMBULATORY_CARE_PROVIDER_SITE_OTHER): Admitting: Certified Nurse Midwife

## 2024-02-28 VITALS — BP 104/42 | HR 83 | Wt 129.6 lb

## 2024-02-28 DIAGNOSIS — Z114 Encounter for screening for human immunodeficiency virus [HIV]: Secondary | ICD-10-CM

## 2024-02-28 DIAGNOSIS — Z01419 Encounter for gynecological examination (general) (routine) without abnormal findings: Secondary | ICD-10-CM

## 2024-02-28 DIAGNOSIS — Z3482 Encounter for supervision of other normal pregnancy, second trimester: Secondary | ICD-10-CM

## 2024-02-28 DIAGNOSIS — Z3A23 23 weeks gestation of pregnancy: Secondary | ICD-10-CM

## 2024-02-28 DIAGNOSIS — Z130 Encounter for screening for diseases of the blood and blood-forming organs and certain disorders involving the immune mechanism: Secondary | ICD-10-CM

## 2024-02-28 DIAGNOSIS — Z113 Encounter for screening for infections with a predominantly sexual mode of transmission: Secondary | ICD-10-CM

## 2024-02-28 DIAGNOSIS — Z3480 Encounter for supervision of other normal pregnancy, unspecified trimester: Secondary | ICD-10-CM

## 2024-02-28 DIAGNOSIS — Z131 Encounter for screening for diabetes mellitus: Secondary | ICD-10-CM

## 2024-02-28 NOTE — Assessment & Plan Note (Signed)
 Some suprapubic discomfort when sneezing or coughing. Comfort measures reviewed. Reviewed 28 week labs for next visit.  Reviewed red flag warning signs

## 2024-03-27 ENCOUNTER — Other Ambulatory Visit

## 2024-03-27 ENCOUNTER — Ambulatory Visit (INDEPENDENT_AMBULATORY_CARE_PROVIDER_SITE_OTHER): Admitting: Advanced Practice Midwife

## 2024-03-27 ENCOUNTER — Encounter: Payer: Self-pay | Admitting: Advanced Practice Midwife

## 2024-03-27 VITALS — BP 92/55 | HR 70 | Wt 140.7 lb

## 2024-03-27 DIAGNOSIS — Z23 Encounter for immunization: Secondary | ICD-10-CM | POA: Diagnosis not present

## 2024-03-27 DIAGNOSIS — Z3492 Encounter for supervision of normal pregnancy, unspecified, second trimester: Secondary | ICD-10-CM | POA: Diagnosis not present

## 2024-03-27 DIAGNOSIS — Z3480 Encounter for supervision of other normal pregnancy, unspecified trimester: Secondary | ICD-10-CM

## 2024-03-27 DIAGNOSIS — Z3A27 27 weeks gestation of pregnancy: Secondary | ICD-10-CM

## 2024-03-27 DIAGNOSIS — Z1379 Encounter for other screening for genetic and chromosomal anomalies: Secondary | ICD-10-CM

## 2024-03-27 DIAGNOSIS — Z113 Encounter for screening for infections with a predominantly sexual mode of transmission: Secondary | ICD-10-CM

## 2024-03-27 DIAGNOSIS — Z114 Encounter for screening for human immunodeficiency virus [HIV]: Secondary | ICD-10-CM

## 2024-03-27 DIAGNOSIS — Z130 Encounter for screening for diseases of the blood and blood-forming organs and certain disorders involving the immune mechanism: Secondary | ICD-10-CM

## 2024-03-27 DIAGNOSIS — Z131 Encounter for screening for diabetes mellitus: Secondary | ICD-10-CM

## 2024-03-27 DIAGNOSIS — Z01419 Encounter for gynecological examination (general) (routine) without abnormal findings: Secondary | ICD-10-CM

## 2024-03-27 NOTE — Progress Notes (Signed)
 Routine Prenatal Care Visit  Subjective  Cynthia Jackson is a 38 y.o. [phone] at [redacted]w[redacted]d being seen today for ongoing prenatal care.  She is currently monitored for the following issues for this low-risk pregnancy and has Anxiety; Carcinoma in situ of cervix; Marijuana use during pregnancy; Dental caries; Postpartum care following vaginal delivery; Encounter for care or examination of lactating mother; and Supervision of low-risk pregnancy on their problem list.  ----------------------------------------------------------------------------------- Patient reports some painful contractions yesterday and some upper abdomen pain/pressure. This went on for a little while and sometimes was constant and sometimes intermittent. She did try hydrating and soaking in the tub. Had a few painful contractions this morning. Nothing currently. Advised increase hydration. Labor precautions reviewed.  28 week labs today. Contractions: Irritability. Vag. Bleeding: None.  Movement: Present. Leaking Fluid denies.  ----------------------------------------------------------------------------------- The following portions of the patient's history were reviewed and updated as appropriate: allergies, current medications, past family history, past medical history, past social history, past surgical history and problem list. Problem list updated.  Objective  BP (!) 92/55   Pulse 70   Wt 140 lb 11.2 oz (63.8 kg)   LMP 09/16/2023   BMI 26.59 kg/m  Pregravid weight 100 lb (45.4 kg) Total Weight Gain 40 lb 11.2 oz (18.5 kg) Urinalysis: Urine Protein    Urine Glucose    Fetal Status: Fetal Heart Rate (bpm): 144 Fundal Height: 28 cm Movement: Present      General:  Alert, oriented and cooperative. Patient is in no acute distress.  Skin: Skin is warm and dry. No rash noted.   Cardiovascular: Normal heart rate noted  Respiratory: Normal respiratory effort, no problems with respiration noted  Abdomen: Soft, gravid,  appropriate for gestational age. Pain/Pressure: Present     Pelvic:  Cervical exam deferred        Extremities: Normal range of motion.  Edema: None  Mental Status: Normal mood and affect. Normal behavior. Normal judgment and thought content.   Assessment   38 y.o. [id number] at [redacted]w[redacted]d by  06/22/2024, by Last Menstrual Period presenting for routine prenatal visit  Plan   G5 Problems (from 11/05/23 to present)    Clinical Staff Provider  Office Location  [facility] Dating  06/22/2024, by Last Menstrual Period  Language  English Anatomy US     Flu Vaccine  no Genetic Screen  NIPS:   TDaP vaccine    Hgb A1C or  GTT Early : Third trimester :   Covid no   LAB RESULTS   Rhogam  O/Positive/-- (06/18 1008)  Blood Type O/Positive/-- (06/18 1008)   RSV  Antibody Negative (06/18 1008)  Feeding Plan breast Rubella 1.04 (06/18 1008)  Contraception Vasectomy? RPR Non Reactive (06/18 1008)   Circumcision yes HBsAg Negative (06/18 1008)   Pediatrician  [facility] HIV Non Reactive (06/18 1008)  Support Person Wilmoth Rasnic Varicella Reactive (06/18 1008)  Prenatal Classes N/a GBS  (For PCN allergy, check sensitivities)     Hep C Non Reactive (06/18 1008)   BTL Consent  Pap Diagnosis  Date Value Ref Range Status  12/21/2021   Final   - Negative for intraepithelial lesion or malignancy (NILM)    VBAC Consent NA Hgb Electro      CF      SMA          Preterm labor symptoms and general obstetric precautions including but not limited to vaginal bleeding, contractions, leaking of fluid and fetal movement were reviewed in detail  with the patient. Please refer to After Visit Summary for other counseling recommendations.   Return in about 2 weeks (around 04/10/2024) for rob.  Slater Rains, CNM 03/27/2024 9:59 AM

## 2024-03-27 NOTE — Patient Instructions (Addendum)
 Braxton Hicks Contractions: Self-Care Braxton Hicks contractions may feel like labor contractions, but they're like practice for real labor. They can start when you're halfway through your pregnancy. During the last 2 months of your pregnancy, these contractions may happen more and hurt more. However, they aren't a sign that you're in real labor. What are Darol Irving contractions? Braxton Hicks contractions make your belly muscles tighten. They aren't real labor because they don't make your cervix, which is the lowest part of your uterus, open and thin. This tightening of your belly before labor starts can also be called false labor. How to tell the difference between true labor and false labor True labor contractions Last 60-90 seconds. Happen on a pattern. Get stronger and last longer over time. Don't go away when you: Walk. Change positions. Rest. Discomfort often begins in the back and then moves to the front. The cervix opens and thins. False labor contractions Are weak and last a short time. Don't happen on a pattern. Happen farther apart than true labor. May go away when you: Walk. Change positions. Rest. May be noticed more at the end of the day. Discomfort is often only felt in the front of the belly. The cervix usually does not open or thin. Sometimes, the only way to tell if you're in true labor is for your health care provider to check your cervix. Your provider will do a physical exam and may monitor your contractions. If you're in true labor, your provider may send you home with instructions about when to return to the hospital or may send you to the hospital right away. Follow these instructions at home:  Take your medicines only as told. Drink more fluids as told. Dehydration may cause these contractions. Dehydration is when there's not enough water in your body. If Braxton Hicks contractions are making you uncomfortable: Change your position or activity. Sit and  rest in a tub of warm water. Do slow and deep breathing several times an hour. Keep all follow-up visits. Your provider will need to check your health and your baby's health. Contact a health care provider if: Your contractions become: Stronger. More regular. Closer together. You have mucus from your vagina that has blood in it. Get help right away if: You feel your baby moving less than usual. You have any amount of fluid that flows from your vagina without stopping. You have signs or symptoms of labor before 37 weeks of pregnancy, such as: Contractions that are 5 minutes or less apart, or that get stronger and last longer. Sudden, sharp pain in your belly or lower back. These symptoms may be an emergency. Call 911 right away. Do not wait to see if the symptoms will go away. Do not drive yourself to the hospital. This information is not intended to replace advice given to you by your health care provider. Make sure you discuss any questions you have with your health care provider. Document Revised: 01/16/2023 Document Reviewed: 01/16/2023 Elsevier Patient Education  2025 [employer]Third Trimester of Pregnancy  The third trimester of pregnancy is from week 28 through week 40. This is months 7 through 9. The third trimester is a time when your baby is growing fast. Body changes during your third trimester Your body continues to change during this time. The changes usually go away after your baby is born. Physical changes You will continue to gain weight. You may get stretch marks on your hips, belly, and breasts. Your breasts will keep growing and may  hurt. A yellow fluid (colostrum) may leak from your breasts. This is the first milk you're making for your baby. Your hair may grow faster and get thicker. In some cases, you may get hair loss. Your belly button may stick out. You may have more swelling in your hands, face, or ankles. Health changes You may have heartburn. You may  feel short of breath. This is caused by the uterus that is now bigger. You may have more aches in the pelvis, back, or thighs. You may have more tingling or numbness in your hands, arms, and legs. You may pee more often. You may have trouble pooping (constipation) or swollen veins in the butt that can itch or get painful (hemorrhoids). Other changes You may have more problems sleeping. You may notice the baby moving lower in your belly (dropping). You may have more fluid coming from your vagina. Your joints may feel loose, and you may have pain around your pelvic bone. Follow these instructions at home: Medicines Take medicines only as told by your health care provider. Some medicines are not safe during pregnancy. Your provider may change the medicines that you take. Do not take any medicines unless told to by your provider. Take a prenatal vitamin that has at least 600 micrograms (mcg) of folic acid. Do not use herbal medicines, illegal drugs, or medicines that are not approved by your provider. Eating and drinking While you're pregnant your body needs additional nutrition to help support your growing baby. Talk with your provider about your nutritional needs. Activity Most women are able to exercise regularly during pregnancy. Exercise routines may need to change at the end of your pregnancy. Talk to your provider about your activities and exercise routine. Relieving pain and discomfort Rest often with your legs raised if you have leg cramps or low back pain. Take warm sitz baths to soothe pain from hemorrhoids. Use hemorrhoid cream if your provider says it's okay. Wear a good, supportive bra if your breasts hurt. Do not use hot tubs, steam rooms, or saunas. Do not douche. Do not use tampons or scented pads. Safety Talk to your provider before traveling far distances. Wear your seatbelt at all times when you're in a car. Talk to your provider if someone hits you, hurts you, or  yells at you. Preparing for birth To prepare for your baby: Take childbirth and breastfeeding classes. Visit the hospital and tour the maternity area. Buy a rear-facing car seat. Learn how to install it in your car. General instructions Avoid cat litter boxes and soil used by cats. These things carry germs that can cause harm to your pregnancy and your baby. Do not drink alcohol, smoke, vape, or use products with nicotine  or tobacco in them. If you need help quitting, talk with your provider. Keep all follow-up visits for your third trimester. Your provider will do more exams and tests during this trimester. Write down your questions. Take them to your prenatal visits. Your provider also will: Talk with you about your overall health. Give you advice or refer you to specialists who can help with different needs, including: Mental health and counseling. Foods and healthy eating. Ask for help if you need help with food. Where to find more information American Pregnancy Association: americanpregnancy.org [employer]: acog.org Office on [employer] Health: [employer] Contact a health care provider if: You have a headache that does not go away when you take medicine. You have any of these problems: You can't eat  or drink. You have nausea and vomiting. You have watery poop (diarrhea) for 2 days or more. You have pain when you pee, or your pee smells bad. You have been sick for 2 days or more and aren't getting better. Contact your provider right away if: You have any of these coming from your vagina: Abnormal discharge. Bad-smelling fluid. Bleeding. Your baby is moving less than usual. You have signs of labor: You have any contractions, belly cramping, or have pain in your pelvis or lower back before 37 weeks of pregnancy (preterm labor). You have regular contractions that are less than 5 minutes apart. Your water breaks. You have symptoms of  high blood pressure or preeclampsia. These include: A severe, throbbing headache that does not go away. Sudden or extreme swelling of your face, hands, legs, or feet. Vision problems: You see spots. You have blurry vision. Your eyes are sensitive to light. If you can't reach your provider, go to an urgent care or emergency room. Get help right away if: You faint, become confused, or can't think clearly. You have chest pain or trouble breathing. You have any kind of injury, such as from a fall or a car crash. These symptoms may be an emergency. Call 911 right away. Do not wait to see if the symptoms will go away. Do not drive yourself to the hospital. This information is not intended to replace advice given to you by your health care provider. Make sure you discuss any questions you have with your health care provider. Document Revised: 03/08/2023 Document Reviewed: 10/06/2022 Elsevier Patient Education  2024 [employer].

## 2024-04-01 LAB — 28 WEEK RH+PANEL
Basophils Absolute: 0 x10E3/uL (ref 0.0–0.2)
Basos: 0 %
EOS (ABSOLUTE): 0.1 x10E3/uL (ref 0.0–0.4)
Eos: 2 %
Gestational Diabetes Screen: 93 mg/dL (ref 70–139)
HIV Screen 4th Generation wRfx: NONREACTIVE
Hematocrit: 36.4 % (ref 34.0–46.6)
Hemoglobin: 11.7 g/dL (ref 11.1–15.9)
Immature Grans (Abs): 0.2 x10E3/uL — ABNORMAL HIGH (ref 0.0–0.1)
Immature Granulocytes: 3 %
Lymphocytes Absolute: 1.2 x10E3/uL (ref 0.7–3.1)
Lymphs: 13 %
MCH: 31.5 pg (ref 26.6–33.0)
MCHC: 32.1 g/dL (ref 31.5–35.7)
MCV: 98 fL — ABNORMAL HIGH (ref 79–97)
Monocytes Absolute: 0.6 x10E3/uL (ref 0.1–0.9)
Monocytes: 6 %
Neutrophils Absolute: 7.3 x10E3/uL — ABNORMAL HIGH (ref 1.4–7.0)
Neutrophils: 76 %
Platelets: 174 x10E3/uL (ref 150–450)
RBC: 3.72 x10E6/uL — ABNORMAL LOW (ref 3.77–5.28)
RDW: 12.8 % (ref 11.7–15.4)
RPR Ser Ql: NONREACTIVE
WBC: 9.5 x10E3/uL (ref 3.4–10.8)

## 2024-04-01 LAB — AFP, SERUM, OPEN SPINA BIFIDA
AFP Value: 167.5 ng/mL
Gest. Age on Collection Date: 27.6 wk
Maternal Age At EDD: 36.1 a
Weight: 140 [lb_av]

## 2024-04-02 ENCOUNTER — Ambulatory Visit: Payer: Self-pay | Admitting: Certified Nurse Midwife

## 2024-04-10 ENCOUNTER — Encounter: Payer: Self-pay | Admitting: Advanced Practice Midwife

## 2024-04-10 ENCOUNTER — Ambulatory Visit: Admitting: Advanced Practice Midwife

## 2024-04-10 VITALS — BP 103/62 | HR 78 | Wt 144.2 lb

## 2024-04-10 DIAGNOSIS — Z3493 Encounter for supervision of normal pregnancy, unspecified, third trimester: Secondary | ICD-10-CM

## 2024-04-10 DIAGNOSIS — Z3480 Encounter for supervision of other normal pregnancy, unspecified trimester: Secondary | ICD-10-CM

## 2024-04-10 DIAGNOSIS — Z3A29 29 weeks gestation of pregnancy: Secondary | ICD-10-CM

## 2024-04-10 NOTE — Progress Notes (Signed)
 Routine Prenatal Care Visit  Subjective  Cynthia Jackson is a 38 y.o. [phone] at [redacted]w[redacted]d being seen today for ongoing prenatal care.  She is currently monitored for the following issues for this low-risk pregnancy and has Anxiety; Carcinoma in situ of cervix; Marijuana use during pregnancy; Dental caries; Postpartum care following vaginal delivery; Encounter for care or examination of lactating mother; and Supervision of low-risk pregnancy on their problem list.  ----------------------------------------------------------------------------------- Patient reports no complaints.  Doing a better job hydrating and hasn't had contractions since last visit.  Contractions: Not present. Vag. Bleeding: None.  Movement: Present. Leaking Fluid denies.  ----------------------------------------------------------------------------------- The following portions of the patient's history were reviewed and updated as appropriate: allergies, current medications, past family history, past medical history, past social history, past surgical history and problem list. Problem list updated.  Objective  BP 103/62   Pulse 78   Wt 144 lb 3.2 oz (65.4 kg)   LMP 09/16/2023   BMI 27.25 kg/m  Pregravid weight 100 lb (45.4 kg) Total Weight Gain 44 lb 3.2 oz (20 kg) Urinalysis: Urine Protein    Urine Glucose    Fetal Status: Fetal Heart Rate (bpm): 138 Fundal Height: 30 cm Movement: Present      General:  Alert, oriented and cooperative. Patient is in no acute distress.  Skin: Skin is warm and dry. No rash noted.   Cardiovascular: Normal heart rate noted  Respiratory: Normal respiratory effort, no problems with respiration noted  Abdomen: Soft, gravid, appropriate for gestational age. Pain/Pressure: Absent     Pelvic:  Cervical exam deferred        Extremities: Normal range of motion.  Edema: None  Mental Status: Normal mood and affect. Normal behavior. Normal judgment and thought content.   Assessment   38  y.o. [id number] at [redacted]w[redacted]d by  06/22/2024, by Last Menstrual Period presenting for routine prenatal visit  Plan   G5 Problems (from 11/05/23 to present)      Problem Noted Diagnosed Resolved   Supervision of low-risk pregnancy 11/05/2023 by Alaina Waddell JONELLE, CMA  No   Overview Addendum 03/27/2024  9:38 AM by Lynda Bradley, CNM   Clinical Staff Provider  Office Location  [facility] Dating  06/22/2024, by Last Menstrual Period  Language  English Anatomy US     Flu Vaccine  no Genetic Screen  NIPS:   TDaP vaccine    Hgb A1C or  GTT Early : Third trimester : 5  Covid no   LAB RESULTS   Rhogam  O/Positive/-- (06/18 1008)  Blood Type O/Positive/-- (06/18 1008)   RSV  Antibody Negative (06/18 1008)  Feeding Plan breast Rubella 1.04 (06/18 1008)  Contraception Vasectomy? RPR Non Reactive (06/18 1008)   Circumcision yes HBsAg Negative (06/18 1008)   Pediatrician  [facility] HIV Non Reactive (06/18 1008)  Support Person Fabienne Nolasco Varicella Reactive (06/18 1008)  Prenatal Classes N/a GBS  (For PCN allergy, check sensitivities)     Hep C Non Reactive (06/18 1008)   BTL Consent  Pap Diagnosis  Date Value Ref Range Status  12/21/2021   Final   - Negative for intraepithelial lesion or malignancy (NILM)    VBAC Consent NA Hgb Electro      CF      SMA                      Preterm labor symptoms and general obstetric precautions including but not limited to vaginal bleeding, contractions,  leaking of fluid and fetal movement were reviewed in detail with the patient. Please refer to After Visit Summary for other counseling recommendations.   Return in about 2 weeks (around 04/24/2024) for rob.   Slater Rains, CNM [facility] 04/10/2024 10:58 AM

## 2024-04-25 ENCOUNTER — Ambulatory Visit: Admitting: Certified Nurse Midwife

## 2024-04-25 ENCOUNTER — Encounter: Payer: Self-pay | Admitting: Certified Nurse Midwife

## 2024-04-25 VITALS — BP 116/63 | HR 86 | Wt 142.0 lb

## 2024-04-25 DIAGNOSIS — Z3A31 31 weeks gestation of pregnancy: Secondary | ICD-10-CM

## 2024-04-25 DIAGNOSIS — Z3493 Encounter for supervision of normal pregnancy, unspecified, third trimester: Secondary | ICD-10-CM | POA: Diagnosis not present

## 2024-04-25 NOTE — Assessment & Plan Note (Signed)
 Reviewed kick counts and preterm labor warning signs. Instructed to call office or come to hospital with persistent headache, vision changes, regular contractions, leaking of fluid, decreased fetal movement or vaginal bleeding. NST reactive, FHR on doppler 110-130.

## 2024-04-25 NOTE — Progress Notes (Signed)
    Return Prenatal Note   Subjective   38 y.o. Cynthia Jackson at [redacted]w[redacted]d presents for this follow-up prenatal visit.  Patient feeling well, active baby. Unsure about epidural vs not-has had previously. Considering vasectomy for partner for contraception. Patient reports: Movement: Present Contractions: Irritability  Objective   Flow sheet Vitals: Pulse Rate: 86 BP: 116/63 Fundal Height: 32 cm Fetal Heart Rate (bpm): 130 Presentation: Vertex (Leopold's) Total weight gain: 42 lb (19.1 kg)  General Appearance  No acute distress, well appearing, and well nourished Pulmonary   Normal work of breathing Neurologic   Alert and oriented to person, place, and time Psychiatric   Mood and affect within normal limits  Fetal Heart Rate A Mode: External Baseline Rate (A): 130 bpm Variability: Moderate Accelerations: 15 x 15 Decelerations: None Multiple birth?: No  Uterine Activity Mode: Palpation, Toco Contraction Frequency (min): x3 Contraction Duration (sec): 30-45 Contraction Quality: Mild Resting Tone Palpated: Relaxed Resting Time: Adequate  Interpretation (Fetal Testing) Nonstress Test Interpretation: Reactive Overall Impression: Reassuring for gestational age   Assessment/Plan   Plan  38 y.o. Cynthia Jackson at [redacted]w[redacted]d presents for follow-up OB visit. Reviewed prenatal record including previous visit note.  Supervision of low-risk pregnancy Reviewed kick counts and preterm labor warning signs. Instructed to call office or come to hospital with persistent headache, vision changes, regular contractions, leaking of fluid, decreased fetal movement or vaginal bleeding. NST reactive, FHR on doppler 110-130.       No orders of the defined types were placed in this encounter.  Return in 2 weeks (on 05/09/2024) for ROB.   Future Appointments  Date Time Provider Department Center  05/09/2024 10:55 AM Slaughterbeck, Damien, CNM [facility] None    For next visit:  continue with routine  prenatal care     Harlene LITTIE Cisco, CNM  04/26/2511:22 PM

## 2024-04-25 NOTE — Patient Instructions (Signed)
 Third Trimester of Pregnancy  The third trimester of pregnancy is from week 28 through week 40. This is months 7 through 9. The third trimester is a time when your baby is growing fast. Body changes during your third trimester Your body continues to change during this time. The changes usually go away after your baby is born. Physical changes You will continue to gain weight. You may get stretch marks on your hips, belly, and breasts. Your breasts will keep growing and may hurt. A yellow fluid (colostrum) may leak from your breasts. This is the first milk you're making for your baby. Your hair may grow faster and get thicker. In some cases, you may get hair loss. Your belly button may stick out. You may have more swelling in your hands, face, or ankles. Health changes You may have heartburn. You may feel short of breath. This is caused by the uterus that is now bigger. You may have more aches in the pelvis, back, or thighs. You may have more tingling or numbness in your hands, arms, and legs. You may pee more often. You may have trouble pooping (constipation) or swollen veins in the butt that can itch or get painful (hemorrhoids). Other changes You may have more problems sleeping. You may notice the baby moving lower in your belly (dropping). You may have more fluid coming from your vagina. Your joints may feel loose, and you may have pain around your pelvic bone. Follow these instructions at home: Medicines Take medicines only as told by your health care provider. Some medicines are not safe during pregnancy. Your provider may change the medicines that you take. Do not take any medicines unless told to by your provider. Take a prenatal vitamin that has at least 600 micrograms (mcg) of folic acid. Do not use herbal medicines, illegal drugs, or medicines that are not approved by your provider. Eating and drinking While you're pregnant your body needs additional nutrition to help  support your growing baby. Talk with your provider about your nutritional needs. Activity Most women are able to exercise regularly during pregnancy. Exercise routines may need to change at the end of your pregnancy. Talk to your provider about your activities and exercise routine. Relieving pain and discomfort Rest often with your legs raised if you have leg cramps or low back pain. Take warm sitz baths to soothe pain from hemorrhoids. Use hemorrhoid cream if your provider says it's okay. Wear a good, supportive bra if your breasts hurt. Do not use hot tubs, steam rooms, or saunas. Do not douche. Do not use tampons or scented pads. Safety Talk to your provider before traveling far distances. Wear your seatbelt at all times when you're in a car. Talk to your provider if someone hits you, hurts you, or yells at you. Preparing for birth To prepare for your baby: Take childbirth and breastfeeding classes. Visit the hospital and tour the maternity area. Buy a rear-facing car seat. Learn how to install it in your car. General instructions Avoid cat litter boxes and soil used by cats. These things carry germs that can cause harm to your pregnancy and your baby. Do not drink alcohol, smoke, vape, or use products with nicotine  or tobacco in them. If you need help quitting, talk with your provider. Keep all follow-up visits for your third trimester. Your provider will do more exams and tests during this trimester. Write down your questions. Take them to your prenatal visits. Your provider also will: Talk with you about  your overall health. Give you advice or refer you to specialists who can help with different needs, including: Mental health and counseling. Foods and healthy eating. Ask for help if you need help with food. Where to find more information American Pregnancy Association: americanpregnancy.org [employer]: acog.org Office on [employer] Health:  travellesson.ca Contact a health care provider if: You have a headache that does not go away when you take medicine. You have any of these problems: You can't eat or drink. You have nausea and vomiting. You have watery poop (diarrhea) for 2 days or more. You have pain when you pee, or your pee smells bad. You have been sick for 2 days or more and aren't getting better. Contact your provider right away if: You have any of these coming from your vagina: Abnormal discharge. Bad-smelling fluid. Bleeding. Your baby is moving less than usual. You have signs of labor: You have any contractions, belly cramping, or have pain in your pelvis or lower back before 37 weeks of pregnancy (preterm labor). You have regular contractions that are less than 5 minutes apart. Your water breaks. You have symptoms of high blood pressure or preeclampsia. These include: A severe, throbbing headache that does not go away. Sudden or extreme swelling of your face, hands, legs, or feet. Vision problems: You see spots. You have blurry vision. Your eyes are sensitive to light. If you can't reach your provider, go to an urgent care or emergency room. Get help right away if: You faint, become confused, or can't think clearly. You have chest pain or trouble breathing. You have any kind of injury, such as from a fall or a car crash. These symptoms may be an emergency. Call 911 right away. Do not wait to see if the symptoms will go away. Do not drive yourself to the hospital. This information is not intended to replace advice given to you by your health care provider. Make sure you discuss any questions you have with your health care provider. Document Revised: 03/08/2023 Document Reviewed: 10/06/2022 Elsevier Patient Education  2024 [employer] Problems to Watch for During Pregnancy During pregnancy, your body goes through many changes. Some changes may be uncomfortable. But most changes are not a serious  problem. It's important to learn when certain signs and symptoms may be a problem. Talk with your health care provider about any medical conditions you have. Make sure you know the symptoms to watch for. Reporting problems early will prevent complications. Problems to watch for during pregnancy You're more likely to get an infection during pregnancy. Let your provider know if you have signs of infection, such as: A fever. A bad-smelling fluid from your vagina. Peeing too often, wanting to pee urgently, or pain when you pee. Also, let your provider know if: You're very tired, you feel dizzy, or you faint. You have watery poop (diarrhea) for 24 hours or longer. You throw up or feel like throwing up for 24 hours or longer. You have cramping in your belly or have pain in your hips or lower back. You have spotting, bleeding, or leaking of fluid from your vagina. You have pain, swelling, or redness in an arm or leg. You should also watch for signs of high blood pressure and preeclampsia. These signs can be very serious. They include: A headache that doesn't go away when you take medicine. Sudden or very bad swelling of your face, hands, legs, or feet. Problems seeing, such as: You see spots. You have  blurry vision. You may be sensitive to light. Why it's important to watch for these problems Watching and reporting problems to your provider can help prevent complications that may affect you and your baby. These include: Higher risk of giving birth early. Infection that may be passed on to your baby. Higher risk for stillbirth. Follow these instructions at home:  Take your medicines only as told. Keep all follow-up visits. Your provider needs to monitor your health and your baby's health. Where to find more information To learn more, go to these websites: Centers for Disease Control and Prevention (CDC) at tonerpromos.no. Then: Click Health Topics A-Z. Type urgent maternal warning signs in the  search box. [employer] (ACOG): acog.org Contact a health care provider if: You have any problems while you're pregnant. You feel your baby moving less than usual. You have any of these things: You have strong emotions, such as sadness or anxiety, that affect your daily life. You do not feel safe in your home. You use tobacco, alcohol, or drugs, and you need help to stop. Get help right away if: You faint, have a seizure, or cannot think clearly. You have chest pain or difficulty breathing. You have any of the following symptoms and you were unable to reach your provider: You have symptoms of infection, including a fever, or have vaginal bleeding. You have symptoms of high blood pressure or preeclampsia. You have signs or symptoms of labor before 37 weeks of pregnancy. These include: Contractions that are 5 minutes or less apart, or that increase in frequency, intensity, or length. Sudden, sharp pain in the belly, or low back pain. Any amount of fluid that flows from your vagina without stopping. These symptoms may be an emergency. Call 911 right away. Do not wait to see if the symptoms will go away. Do not drive yourself to the hospital. This information is not intended to replace advice given to you by your health care provider. Make sure you discuss any questions you have with your health care provider. Document Revised: 11/14/2022 Document Reviewed: 11/14/2022 Elsevier Patient Education  2024 Elsevier Inc. Signs and Symptoms of Labor Labor is the body's natural process of moving the baby and the placenta out of the uterus. The process of labor usually starts when the baby is full-term, between 44 and 41 weeks of pregnancy. Signs and symptoms that you are close to going into labor As your body prepares for labor and the birth of your baby, you may notice the following symptoms in the weeks and days before true labor starts: Passing a small  amount of thick, bloody mucus from your vagina. This is called normal bloody show or losing your mucus plug. This may happen more than a week before labor begins, or right before labor begins, as the opening of the cervix starts to widen (dilate). For some women, the entire mucus plug passes at once. For others, pieces of the mucus plug may gradually pass over several days. Your baby moving (dropping) lower in your pelvis to get into position for birth (lightening). When this happens, you may feel more pressure on your bladder and pelvic bone and less pressure on your ribs. This may make it easier to breathe. It may also cause you to need to urinate more often and have problems with bowel movements. Having practice contractions, also called Braxton Hicks contractions or false labor. These occur at irregular (unevenly spaced) intervals that are more than 10 minutes apart. False labor  contractions are common after exercise or sexual activity. They will stop if you change position, rest, or drink fluids. These contractions are usually mild and do not get stronger over time. They may feel like: A backache or back pain. Mild cramps, similar to menstrual cramps. Tightening or pressure in your abdomen. Other early symptoms include: Nausea or loss of appetite. Diarrhea. Having a sudden burst of energy, or feeling very tired. Mood changes. Having trouble sleeping. Signs and symptoms that labor has begun Signs that you are in labor may include: Having contractions that come at regular (evenly spaced) intervals and increase in intensity. This may feel like more intense tightening or pressure in your abdomen that moves to your back. Contractions may also feel like rhythmic pain in your upper thighs or back that comes and goes at regular intervals. If you are delivering for the first time, this change in intensity of contractions often occurs at a more gradual pace. If you have given birth before, you may  notice a more rapid progression of contraction changes. Feeling pressure in the vaginal area. Your water breaking (rupture of membranes). This is when the sac of fluid that surrounds your baby breaks. Fluid leaking from your vagina may be clear or blood-tinged. Labor usually starts within 24 hours of your water breaking, but it may take longer to begin. Some people may feel a sudden gush of fluid; others may notice repeatedly damp underwear. Follow these instructions at home:  When labor starts, or if your water breaks, call your health care provider or nurse care line. Based on your situation, they will determine when you should go in for an exam. During early labor, you may be able to rest and manage symptoms at home. Some strategies to try at home include: Breathing and relaxation techniques. Taking a warm bath or shower. Listening to music. Using a heating pad on the lower back for pain. If directed, apply heat to the area as often as told by your health care provider. Use the heat source that your health care provider recommends, such as a moist heat pack or a heating pad. Place a towel between your skin and the heat source. Leave the heat on for 20-30 minutes. Remove the heat if your skin turns bright red. This is especially important if you are unable to feel pain, heat, or cold. You have a greater risk of getting burned. Contact a health care provider if: Your labor has started. Your water breaks. You have nausea, vomiting, or diarrhea. Get help right away if: You have painful, regular contractions that are 5 minutes apart or less. Labor starts before you are [redacted] weeks along in your pregnancy. You have a fever. You have bright red blood coming from your vagina. You do not feel your baby moving. You have a severe headache with or without vision problems. You have chest pain or shortness of breath. These symptoms may represent a serious problem that is an emergency. Do not wait to see  if the symptoms will go away. Get medical help right away. Call your local emergency services (911 in the [place] Do not drive yourself to the hospital. Summary Labor is your body's natural process of moving your baby and the placenta out of your uterus. The process of labor usually starts when your baby is full-term, between 80 and 40 weeks of pregnancy. When labor starts, or if your water breaks, call your health care provider or nurse care line. Based on your situation, they will  determine when you should go in for an exam. This information is not intended to replace advice given to you by your health care provider. Make sure you discuss any questions you have with your health care provider. Document Revised: 10/19/2020 Document Reviewed: 10/19/2020 Elsevier Patient Education  2024 [employer].

## 2024-05-09 ENCOUNTER — Ambulatory Visit (INDEPENDENT_AMBULATORY_CARE_PROVIDER_SITE_OTHER): Admitting: Certified Nurse Midwife

## 2024-05-09 ENCOUNTER — Encounter: Payer: Self-pay | Admitting: Certified Nurse Midwife

## 2024-05-09 VITALS — BP 115/52 | HR 95 | Wt 142.5 lb

## 2024-05-09 DIAGNOSIS — Z3493 Encounter for supervision of normal pregnancy, unspecified, third trimester: Secondary | ICD-10-CM

## 2024-05-09 DIAGNOSIS — F419 Anxiety disorder, unspecified: Secondary | ICD-10-CM | POA: Diagnosis not present

## 2024-05-09 MED ORDER — HYDROXYZINE HCL 25 MG PO TABS: 25.0000 mg | ORAL_TABLET | Freq: Four times a day (QID) | ORAL | 1 refills | Status: DC | PRN

## 2024-05-09 NOTE — Assessment & Plan Note (Addendum)
 Presents today with increased symptoms of anxiety and depression. She is having moments where she feels panicky and other moments where she is tired and sad. This symptoms became worse a week ago with no triggering event.  She has had interrupted sleep due to anxiety and pregnancy discomfort. She has had a low appetitie. We reviewed the need for sleep, hydration, food, connection and time outdoors as a baseline to help with mood issues. We formed a plan to add in protein shakes, time with family, time outside on the porch and PO hydroxyzine  for sleep. She does not desire to begin a SSRI at this time. She endorses personal safety from self harm or harm to others.

## 2024-05-09 NOTE — Progress Notes (Signed)
    Return Prenatal Note   Subjective   38 y.o. H4E6986 at [redacted]w[redacted]d presents for this follow-up prenatal visit.  Patient increased depression and anxiety Patient reports: Movement: Present Contractions: Not present  Objective   Flow sheet Vitals: Pulse Rate: 95 BP: (!) 115/52 Fundal Height: 33 cm Fetal Heart Rate (bpm): 132 Total weight gain: 42 lb 8 oz (19.3 kg)  General Appearance  No acute distress, well appearing, and well nourished Pulmonary   Normal work of breathing Neurologic   Alert and oriented to person, place, and time Psychiatric   Mood and affect within normal limits   Assessment/Plan   Plan  38 y.o. [id number] at [redacted]w[redacted]d presents for follow-up OB visit. Reviewed prenatal record including previous visit note.  Anxiety Presents today with increased symptoms of anxiety and depression. She is having moments where she feels panicky and other moments where she is tired and sad. This symptoms became worse a week ago with no triggering event.  She has had interrupted sleep due to anxiety and pregnancy discomfort. She has had a low appetitie. We reviewed the need for sleep, hydration, food, connection and time outdoors as a baseline to help with mood issues. We formed a plan to add in protein shakes, time with family, time outside on the porch and PO hydroxyzine  for sleep. She does not desire to begin a SSRI at this time. She endorses personal safety from self harm or harm to others.   Supervision of low-risk pregnancy Reviewed kick counts and preterm labor warning signs. Instructed to call office or come to hospital with persistent headache, vision changes, regular contractions, leaking of fluid, decreased fetal movement or vaginal bleeding.      Future Appointments  Date Time Provider Department Center  05/27/2024  9:35 AM Dominic, Jinnie Jansky, CNM [facility] None    For next visit:  continue with routine prenatal care     Damien Parsley, CNM [facility]  [place] 11/21/251:48 PM

## 2024-05-09 NOTE — Assessment & Plan Note (Signed)
 Reviewed kick counts and preterm labor warning signs. Instructed to call office or come to hospital with persistent headache, vision changes, regular contractions, leaking of fluid, decreased fetal movement or vaginal bleeding.

## 2024-05-27 ENCOUNTER — Encounter: Admitting: Licensed Practical Nurse

## 2024-05-29 ENCOUNTER — Encounter: Payer: Self-pay | Admitting: Certified Nurse Midwife

## 2024-05-29 ENCOUNTER — Ambulatory Visit: Admitting: Certified Nurse Midwife

## 2024-05-29 ENCOUNTER — Other Ambulatory Visit (HOSPITAL_COMMUNITY)
Admission: RE | Admit: 2024-05-29 | Discharge: 2024-05-29 | Disposition: A | Source: Ambulatory Visit | Attending: Certified Nurse Midwife | Admitting: Certified Nurse Midwife

## 2024-05-29 VITALS — BP 128/65 | HR 91 | Wt 150.3 lb

## 2024-05-29 DIAGNOSIS — Z3685 Encounter for antenatal screening for Streptococcus B: Secondary | ICD-10-CM | POA: Diagnosis not present

## 2024-05-29 DIAGNOSIS — Z369 Encounter for antenatal screening, unspecified: Secondary | ICD-10-CM

## 2024-05-29 DIAGNOSIS — Z3A36 36 weeks gestation of pregnancy: Secondary | ICD-10-CM

## 2024-05-29 DIAGNOSIS — Z113 Encounter for screening for infections with a predominantly sexual mode of transmission: Secondary | ICD-10-CM | POA: Diagnosis present

## 2024-05-29 DIAGNOSIS — Z23 Encounter for immunization: Secondary | ICD-10-CM | POA: Diagnosis not present

## 2024-05-29 DIAGNOSIS — Z2911 Encounter for prophylactic immunotherapy for respiratory syncytial virus (RSV): Secondary | ICD-10-CM

## 2024-05-29 DIAGNOSIS — Z3493 Encounter for supervision of normal pregnancy, unspecified, third trimester: Secondary | ICD-10-CM | POA: Diagnosis not present

## 2024-05-29 NOTE — Progress Notes (Signed)
° ° °  Return Prenatal Note   Subjective   38 y.o. H4E6986 at [redacted]w[redacted]d presents for this follow-up prenatal visit.  Patient feeling well but tired. Would like an epidural if there's enough time in labor. Desires RSV today. Patient reports: Movement: Present Contractions: Regular  Objective   Flow sheet Vitals: Pulse Rate: 91 BP: 128/65 Fundal Height: 36 cm Fetal Heart Rate (bpm): 150 Presentation: Vertex (Leopold's) Dilation: Closed Total weight gain: 50 lb 4.8 oz (22.8 kg)  General Appearance  No acute distress, well appearing, and well nourished Pulmonary   Normal work of breathing Neurologic   Alert and oriented to person, place, and time Psychiatric   Mood and affect within normal limits   Assessment/Plan   Plan  38 y.o. [id number] at [redacted]w[redacted]d presents for follow-up OB visit. Reviewed prenatal record including previous visit note.  Supervision of low-risk pregnancy Reviewed kick counts and preterm labor warning signs. Instructed to call office or come to hospital with persistent headache, vision changes, regular contractions, leaking of fluid, decreased fetal movement or vaginal bleeding. GBS & GC/Ct collected today.      Orders Placed This Encounter  Procedures   Strep Gp B NAA   Respiratory syncytial virus vaccine, preF, subunit, bivalent,(Abrysvo)   Return in 1 week (on 06/05/2024) for ROB.   Future Appointments  Date Time Provider Department Center  06/09/2024 10:35 AM Dominic, Jinnie Jansky, CNM [facility] None    For next visit:  continue with routine prenatal care     Harlene LITTIE Cisco, CNM  12/11/202512:28 PM

## 2024-05-29 NOTE — Patient Instructions (Addendum)
 RSV Vaccine: What You Need to Know Many vaccine information statements are available in Spanish and other languages. See classthemes.se. 1. Why get vaccinated? RSV vaccine can prevent lower respiratory tract disease caused by respiratory syncytial virus (RSV). RSV is a common respiratory virus that usually causes mild, cold-like symptoms. RSV can cause illness in people of all ages but may be especially serious for infants and older adults. RSV is the most common cause of hospitalization in [place] infants. Infants up to 27 months of age (especially those 6 months and younger) and children who were born prematurely, or who have chronic lung or heart disease, or a weakened immune system, are at increased risk of severe RSV disease. RSV infections can be dangerous for certain adults. Adults at highest risk for severe RSV disease include older adults, especially those with chronic heart or lung disease, a weakened immune system, certain other chronic medical conditions, or who live in nursing homes. RSV spreads through direct contact with the virus, such as when droplets from an infected person's cough or sneeze contact your eyes, nose, or mouth. It can also be spread by someone touching a surface, such as a doorknob, that has the virus on it, and then touching your face. Symptoms of RSV infection may include runny nose, decreased appetite, coughing, sneezing, fever, or wheezing. In very young infants, symptoms of RSV may also include irritability (fussiness), decreased activity, or apnea (pauses in breathing for more than 10 seconds). Most people recover in a week or two, but RSV can be more serious, resulting in shortness of breath and low oxygen levels. RSV can cause bronchiolitis (inflammation of the small airways in the lung) and pneumonia (infection of the lungs). RSV can also lead to worsening of other medical conditions such as asthma, chronic obstructive pulmonary disease (a chronic disease of the  lungs that makes it hard to breathe), or heart failure (when the heart cannot pump enough blood and oxygen throughout the body). Infants and older adults who get very sick from RSV may need to be hospitalized. Some may even die. 2. RSV vaccine There are two immunization options available for protecting infants against RSV: maternal vaccine for the pregnant person or preventive antibodies given to the baby. Only one of these options is needed for most babies to be protected. CDC recommends a one-time dose of RSV vaccine for pregnant people from week 32 through week 36 of pregnancy for the prevention of RSV disease in their infants during the first 6 months of life. This vaccine is recommended to be given from September through January for most of the [place] . However, in some locations (for example, the territories, [place] , [place] , and parts of [place] ), the timing of vaccination may differ based on the time of year when RSV circulates in the area. CDC recommends a one-time-dose of RSV vaccine for everyone 75 years and older and for adults 54 through 36 years of age who are at increased risk of severe RSV disease. Adults 23 through 36 years old who are at increased risk include those with chronic heart or lung disease, a weakened immune system, or certain other chronic medical conditions, and those who are residents of nursing homes. RSV vaccine may be given at the same time as other vaccines. 3. Talk with your health care provider Tell your vaccination provider if the person getting the vaccine: Has had an allergic reaction after a previous dose of RSV vaccine, or has any severe, life-threatening allergies In some cases, your health  care provider may decide to postpone RSV vaccination until a future visit.  People with minor illnesses, such as a cold, may be vaccinated. People who are moderately or severely ill should usually wait until they recover before getting RSV vaccine.  Your health care  provider can give you more information. 4. Risks of a vaccine reaction Pain, redness, and swelling where the shot is given, fatigue (feeling tired), fever, headache, nausea, diarrhea, and muscle or joint pain can happen after RSV vaccination. Serious neurologic conditions, including Guillain-Barr syndrome (GBS), have been reported after RSV vaccination in some older adults. At this time, an increased risk of GBS following RSV vaccine among persons aged 68 years and older cannot be confirmed or ruled out. Preterm birth and high blood pressure during pregnancy, including pre-eclampsia, have been reported among pregnant people who received RSV vaccine. It is unclear whether these events were caused by the vaccine. People sometimes faint after medical procedures, including vaccination. Tell your provider if you feel dizzy or have vision changes or ringing in the ears.  As with any medicine, there is a very remote chance of a vaccine causing a severe allergic reaction, other serious injury, or death. V-Safe is a safety monitoring system that lets you share with CDC how you, or your dependent, feel after getting RSV vaccine. You can find information and enroll in V-Safe radarlocations.no. 5. What if there is a serious problem? An allergic reaction could occur after the vaccinated person leaves the clinic. If you see signs of a severe allergic reaction (hives, swelling of the face and throat, difficulty breathing, a fast heartbeat, dizziness, or weakness), call 9-1-1 and get the person to the nearest hospital. For other signs that concern you, call your health care provider.  Adverse reactions should be reported to the Vaccine Adverse Event Reporting System (VAERS). Your health care provider will usually file this report, or you can do it yourself. Visit the VAERS website at www.vaers.lagents.no or call 253 633 5729. VAERS is only for reporting reactions, and VAERS staff members do not give medical advice. 6. How  can I learn more? Ask your health care provider. Call your local or state health department. Visit the website of the Food and Drug Administration (FDA) for vaccine package inserts and additional information at finderlist.no Contact the Centers for Disease Control and Prevention (CDC): Call 574-875-6866 (1-800-CDC-INFO) or Visit CDC's vaccine website at piccapture.uy Source: CDC Vaccine Information Statement RSV (Respiratory Syncytial Virus) Vaccine (04/05/2023) This same material is available at tonerpromos.no for no charge. This information is not intended to replace advice given to you by your health care provider. Make sure you discuss any questions you have with your health care provider. Document Revised: 05/01/2023 Document Reviewed: 06/21/2022 Elsevier Patient Education  2024 Elsevier Inc.Third Trimester of Pregnancy  The third trimester of pregnancy is from week 28 through week 40. This is months 7 through 9. The third trimester is a time when your baby is growing fast. Body changes during your third trimester Your body continues to change during this time. The changes usually go away after your baby is born. Physical changes You will continue to gain weight. You may get stretch marks on your hips, belly, and breasts. Your breasts will keep growing and may hurt. A yellow fluid (colostrum) may leak from your breasts. This is the first milk you're making for your baby. Your hair may grow faster and get thicker. In some cases, you may get hair loss. Your belly button may stick out. You  may have more swelling in your hands, face, or ankles. Health changes You may have heartburn. You may feel short of breath. This is caused by the uterus that is now bigger. You may have more aches in the pelvis, back, or thighs. You may have more tingling or numbness in your hands, arms, and legs. You may pee more often. You may have trouble pooping (constipation)  or swollen veins in the butt that can itch or get painful (hemorrhoids). Other changes You may have more problems sleeping. You may notice the baby moving lower in your belly (dropping). You may have more fluid coming from your vagina. Your joints may feel loose, and you may have pain around your pelvic bone. Follow these instructions at home: Medicines Take medicines only as told by your health care provider. Some medicines are not safe during pregnancy. Your provider may change the medicines that you take. Do not take any medicines unless told to by your provider. Take a prenatal vitamin that has at least 600 micrograms (mcg) of folic acid. Do not use herbal medicines, illegal drugs, or medicines that are not approved by your provider. Eating and drinking While you're pregnant your body needs additional nutrition to help support your growing baby. Talk with your provider about your nutritional needs. Activity Most women are able to exercise regularly during pregnancy. Exercise routines may need to change at the end of your pregnancy. Talk to your provider about your activities and exercise routine. Relieving pain and discomfort Rest often with your legs raised if you have leg cramps or low back pain. Take warm sitz baths to soothe pain from hemorrhoids. Use hemorrhoid cream if your provider says it's okay. Wear a good, supportive bra if your breasts hurt. Do not use hot tubs, steam rooms, or saunas. Do not douche. Do not use tampons or scented pads. Safety Talk to your provider before traveling far distances. Wear your seatbelt at all times when you're in a car. Talk to your provider if someone hits you, hurts you, or yells at you. Preparing for birth To prepare for your baby: Take childbirth and breastfeeding classes. Visit the hospital and tour the maternity area. Buy a rear-facing car seat. Learn how to install it in your car. General instructions Avoid cat litter boxes and  soil used by cats. These things carry germs that can cause harm to your pregnancy and your baby. Do not drink alcohol, smoke, vape, or use products with nicotine  or tobacco in them. If you need help quitting, talk with your provider. Keep all follow-up visits for your third trimester. Your provider will do more exams and tests during this trimester. Write down your questions. Take them to your prenatal visits. Your provider also will: Talk with you about your overall health. Give you advice or refer you to specialists who can help with different needs, including: Mental health and counseling. Foods and healthy eating. Ask for help if you need help with food. Where to find more information American Pregnancy Association: americanpregnancy.org [employer]: acog.org Office on [employer] Health: travellesson.ca Contact a health care provider if: You have a headache that does not go away when you take medicine. You have any of these problems: You can't eat or drink. You have nausea and vomiting. You have watery poop (diarrhea) for 2 days or more. You have pain when you pee, or your pee smells bad. You have been sick for 2 days or more and aren't getting better. Contact your  provider right away if: You have any of these coming from your vagina: Abnormal discharge. Bad-smelling fluid. Bleeding. Your baby is moving less than usual. You have signs of labor: You have any contractions, belly cramping, or have pain in your pelvis or lower back before 37 weeks of pregnancy (preterm labor). You have regular contractions that are less than 5 minutes apart. Your water breaks. You have symptoms of high blood pressure or preeclampsia. These include: A severe, throbbing headache that does not go away. Sudden or extreme swelling of your face, hands, legs, or feet. Vision problems: You see spots. You have blurry vision. Your eyes are sensitive to light. If you  can't reach your provider, go to an urgent care or emergency room. Get help right away if: You faint, become confused, or can't think clearly. You have chest pain or trouble breathing. You have any kind of injury, such as from a fall or a car crash. These symptoms may be an emergency. Call 911 right away. Do not wait to see if the symptoms will go away. Do not drive yourself to the hospital. This information is not intended to replace advice given to you by your health care provider. Make sure you discuss any questions you have with your health care provider. Document Revised: 03/08/2023 Document Reviewed: 10/06/2022 Elsevier Patient Education  2024 Elsevier Inc. Group B Streptococcus Infection During Pregnancy Group B Streptococcus (GBS) is a type of bacteria that is often found in healthy people. It is commonly found in the rectum, vagina, and intestines. In people who are healthy and not pregnant, the bacteria rarely cause serious illness or complications. However, women who test positive for GBS during pregnancy can pass the bacteria to the baby during childbirth. This can cause serious infection in the baby after birth. Women with GBS may also have infections during their pregnancy or soon after childbirth. The infections include urinary tract infections (UTIs) or infections of the uterus. GBS also increases a woman's risk of complications during pregnancy, such as early labor or delivery, miscarriage, or stillbirth. Routine testing for GBS is recommended for all pregnant women. What are the causes? This condition is caused by bacteria called Streptococcus agalactiae. What increases the risk? You may have a higher risk for GBS infection during pregnancy if you had one during a past pregnancy. What are the signs or symptoms? In most cases, GBS infection does not cause symptoms in pregnant women. If symptoms exist, they may include: Labor that starts before the 37th week of pregnancy. A UTI  or bladder infection. This may cause a fever, frequent urination, or pain and burning during urination. Fever during labor. There can also be a rapid heartbeat in the mother or baby. Rare but serious symptoms of a GBS infection in women include: Blood infection (septicemia). This may cause fever, chills, or confusion. Lung infection (pneumonia). This may cause fever, chills, cough, rapid breathing, chest pain, or difficulty breathing. Bone, joint, skin, or soft tissue infection. How is this diagnosed? You may be screened for GBS between week 35 and week 37 of pregnancy. If you have symptoms of preterm labor, you may be screened earlier. This condition is diagnosed based on lab test results from: A swab of fluid from the vagina and rectum. A urine sample. How is this treated? This condition is treated with antibiotic medicine. Antibiotic medicine may be given: To you when you go into labor, or as soon as your water breaks. The medicines will continue until after you give  birth. If you are having a cesarean delivery, you do not need antibiotics unless your water has broken. To your baby, if he or she requires treatment. Your health care provider will check your baby to decide if he or she needs antibiotics to prevent a serious infection. Follow these instructions at home: Take over-the-counter and prescription medicines only as told by your health care provider. Take your antibiotic medicine as told by your health care provider. Do not stop taking the antibiotic even if you start to feel better. Keep all pre-birth (prenatal) visits and follow-up visits as told by your health care provider. This is important. Contact a health care provider if: You have pain or burning when you urinate. You have to urinate more often than usual. You have a fever or chills. You develop a bad-smelling vaginal discharge. Get help right away if: Your water breaks. You go into labor. You have severe pain in your  abdomen. You have difficulty breathing. You have chest pain. These symptoms may represent a serious problem that is an emergency. Do not wait to see if the symptoms will go away. Get medical help right away. Call your local emergency services (911 in the [place] Do not drive yourself to the hospital. Summary GBS is a type of bacteria that is common in healthy people. During pregnancy, colonization with GBS can cause serious complications for you or your baby. Your health care provider will screen you between 35 and 37 weeks of pregnancy to determine if you are colonized with GBS. If you are colonized with GBS during pregnancy, your health care provider will recommend antibiotics through an IV during labor. After delivery, your baby will be evaluated for complications related to potential GBS infection and may require antibiotics to prevent a serious infection. This information is not intended to replace advice given to you by your health care provider. Make sure you discuss any questions you have with your health care provider. Document Revised: 05/22/2022 Document Reviewed: 05/22/2022 Elsevier Patient Education  2024 Elsevier Inc. Problems to Watch for During Pregnancy During pregnancy, your body goes through many changes. Some changes may be uncomfortable. But most changes are not a serious problem. It's important to learn when certain signs and symptoms may be a problem. Talk with your health care provider about any medical conditions you have. Make sure you know the symptoms to watch for. Reporting problems early will prevent complications. Problems to watch for during pregnancy You're more likely to get an infection during pregnancy. Let your provider know if you have signs of infection, such as: A fever. A bad-smelling fluid from your vagina. Peeing too often, wanting to pee urgently, or pain when you pee. Also, let your provider know if: You're very tired, you feel dizzy, or you  faint. You have watery poop (diarrhea) for 24 hours or longer. You throw up or feel like throwing up for 24 hours or longer. You have cramping in your belly or have pain in your hips or lower back. You have spotting, bleeding, or leaking of fluid from your vagina. You have pain, swelling, or redness in an arm or leg. You should also watch for signs of high blood pressure and preeclampsia. These signs can be very serious. They include: A headache that doesn't go away when you take medicine. Sudden or very bad swelling of your face, hands, legs, or feet. Problems seeing, such as: You see spots. You have blurry vision. You may be sensitive to light. Why it's important to  watch for these problems Watching and reporting problems to your provider can help prevent complications that may affect you and your baby. These include: Higher risk of giving birth early. Infection that may be passed on to your baby. Higher risk for stillbirth. Follow these instructions at home:  Take your medicines only as told. Keep all follow-up visits. Your provider needs to monitor your health and your baby's health. Where to find more information To learn more, go to these websites: Centers for Disease Control and Prevention (CDC) at tonerpromos.no. Then: Click Health Topics A-Z. Type urgent maternal warning signs in the search box. [employer] (ACOG): acog.org Contact a health care provider if: You have any problems while you're pregnant. You feel your baby moving less than usual. You have any of these things: You have strong emotions, such as sadness or anxiety, that affect your daily life. You do not feel safe in your home. You use tobacco, alcohol, or drugs, and you need help to stop. Get help right away if: You faint, have a seizure, or cannot think clearly. You have chest pain or difficulty breathing. You have any of the following symptoms and you were unable to  reach your provider: You have symptoms of infection, including a fever, or have vaginal bleeding. You have symptoms of high blood pressure or preeclampsia. You have signs or symptoms of labor before 37 weeks of pregnancy. These include: Contractions that are 5 minutes or less apart, or that increase in frequency, intensity, or length. Sudden, sharp pain in the belly, or low back pain. Any amount of fluid that flows from your vagina without stopping. These symptoms may be an emergency. Call 911 right away. Do not wait to see if the symptoms will go away. Do not drive yourself to the hospital. This information is not intended to replace advice given to you by your health care provider. Make sure you discuss any questions you have with your health care provider. Document Revised: 11/14/2022 Document Reviewed: 11/14/2022 Elsevier Patient Education  2024 [employer].

## 2024-05-29 NOTE — Assessment & Plan Note (Signed)
 Reviewed kick counts and preterm labor warning signs. Instructed to call office or come to hospital with persistent headache, vision changes, regular contractions, leaking of fluid, decreased fetal movement or vaginal bleeding. GBS & GC/Ct collected today.

## 2024-05-30 LAB — CERVICOVAGINAL ANCILLARY ONLY
Chlamydia: NEGATIVE
Comment: NEGATIVE
Comment: NORMAL
Neisseria Gonorrhea: NEGATIVE

## 2024-05-31 LAB — STREP GP B NAA: Strep Gp B NAA: NEGATIVE

## 2024-06-06 NOTE — Progress Notes (Unsigned)
" ° ° °  Return Prenatal Note   Subjective   38 y.o. [id number] at [redacted]w[redacted]d presents for this follow-up prenatal visit.  Patient *** Patient reports:    Objective   Flow sheet Vitals:   Total weight gain: 50 lb 4.8 oz (22.8 kg)  General Appearance  No acute distress, well appearing, and well nourished Pulmonary   Normal work of breathing Neurologic   Alert and oriented to person, place, and time Psychiatric   Mood and affect within normal limits   Assessment/Plan   Plan  38 y.o. [id number] at [redacted]w[redacted]d presents for follow-up OB visit. Reviewed prenatal record including previous visit note.  No problem-specific Assessment & Plan notes found for this encounter.      No orders of the defined types were placed in this encounter.  No follow-ups on file.   Future Appointments  Date Time Provider Department Center  06/09/2024 10:35 AM , Jinnie Jansky, CNM [facility] None    For next visit:  {[id number]}     Rollo JINNY Maxin, CMA  12/19/20254:36 PM  "

## 2024-06-09 ENCOUNTER — Ambulatory Visit (INDEPENDENT_AMBULATORY_CARE_PROVIDER_SITE_OTHER): Admitting: Licensed Practical Nurse

## 2024-06-09 VITALS — BP 105/69 | HR 94 | Wt 154.5 lb

## 2024-06-09 DIAGNOSIS — Z3483 Encounter for supervision of other normal pregnancy, third trimester: Secondary | ICD-10-CM

## 2024-06-09 DIAGNOSIS — Z3A37 37 weeks gestation of pregnancy: Secondary | ICD-10-CM

## 2024-06-09 DIAGNOSIS — Z3A38 38 weeks gestation of pregnancy: Secondary | ICD-10-CM

## 2024-06-09 NOTE — Assessment & Plan Note (Signed)
-  feels ready for labor, her oldest daughter would like to present -has family to watch her younger children -TWG 16lns, starting BMI 18.9  -warning signs reviewed

## 2024-06-17 ENCOUNTER — Ambulatory Visit (INDEPENDENT_AMBULATORY_CARE_PROVIDER_SITE_OTHER): Admitting: Certified Nurse Midwife

## 2024-06-17 VITALS — BP 115/78 | HR 93 | Wt 156.5 lb

## 2024-06-17 DIAGNOSIS — Z3483 Encounter for supervision of other normal pregnancy, third trimester: Secondary | ICD-10-CM

## 2024-06-17 DIAGNOSIS — Z3A39 39 weeks gestation of pregnancy: Secondary | ICD-10-CM | POA: Diagnosis not present

## 2024-06-17 NOTE — Progress Notes (Signed)
 Rob doing well, feeling good movement. Discussed option of induction 41 weeks should she not go into labor before then. She will discuss with her partner for discuss next visit. Discussed NST next visit due to being 40 weeks . She is in agreement. Sve per pt request. 1.5 cm/60/-3 posterior. Labor precautions reviewed. Follow up 1 wk for ROB and NST.   Zelda Hummer, CNM

## 2024-06-17 NOTE — Progress Notes (Signed)
[id number] 

## 2024-06-17 NOTE — Patient Instructions (Signed)
 Signs and Symptoms of Labor Labor is the body's natural process of moving the baby and the placenta out of the uterus. The process of labor usually starts when the baby is full-term, between 74 and 41 weeks of pregnancy. Signs and symptoms that you are close to going into labor As your body prepares for labor and the birth of your baby, you may notice the following symptoms in the weeks and days before true labor starts: Passing a small amount of thick, bloody mucus from your vagina. This is called normal bloody show or losing your mucus plug. This may happen more than a week before labor begins, or right before labor begins, as the opening of the cervix starts to widen (dilate). For some women, the entire mucus plug passes at once. For others, pieces of the mucus plug may gradually pass over several days. Your baby moving (dropping) lower in your pelvis to get into position for birth (lightening). When this happens, you may feel more pressure on your bladder and pelvic bone and less pressure on your ribs. This may make it easier to breathe. It may also cause you to need to urinate more often and have problems with bowel movements. Having "practice contractions," also called Braxton Hicks contractions or false labor. These occur at irregular (unevenly spaced) intervals that are more than 10 minutes apart. False labor contractions are common after exercise or sexual activity. They will stop if you change position, rest, or drink fluids. These contractions are usually mild and do not get stronger over time. They may feel like: A backache or back pain. Mild cramps, similar to menstrual cramps. Tightening or pressure in your abdomen. Other early symptoms include: Nausea or loss of appetite. Diarrhea. Having a sudden burst of energy, or feeling very tired. Mood changes. Having trouble sleeping. Signs and symptoms that labor has begun Signs that you are in labor may include: Having contractions that come  at regular (evenly spaced) intervals and increase in intensity. This may feel like more intense tightening or pressure in your abdomen that moves to your back. Contractions may also feel like rhythmic pain in your upper thighs or back that comes and goes at regular intervals. If you are delivering for the first time, this change in intensity of contractions often occurs at a more gradual pace. If you have given birth before, you may notice a more rapid progression of contraction changes. Feeling pressure in the vaginal area. Your water breaking (rupture of membranes). This is when the sac of fluid that surrounds your baby breaks. Fluid leaking from your vagina may be clear or blood-tinged. Labor usually starts within 24 hours of your water breaking, but it may take longer to begin. Some people may feel a sudden gush of fluid; others may notice repeatedly damp underwear. Follow these instructions at home:  When labor starts, or if your water breaks, call your health care provider or nurse care line. Based on your situation, they will determine when you should go in for an exam. During early labor, you may be able to rest and manage symptoms at home. Some strategies to try at home include: Breathing and relaxation techniques. Taking a warm bath or shower. Listening to music. Using a heating pad on the lower back for pain. If directed, apply heat to the area as often as told by your health care provider. Use the heat source that your health care provider recommends, such as a moist heat pack or a heating pad. Place a  towel between your skin and the heat source. Leave the heat on for 20-30 minutes. Remove the heat if your skin turns bright red. This is especially important if you are unable to feel pain, heat, or cold. You have a greater risk of getting burned. Contact a health care provider if: Your labor has started. Your water breaks. You have nausea, vomiting, or diarrhea. Get help right away  if: You have painful, regular contractions that are 5 minutes apart or less. Labor starts before you are [redacted] weeks along in your pregnancy. You have a fever. You have bright red blood coming from your vagina. You do not feel your baby moving. You have a severe headache with or without vision problems. You have chest pain or shortness of breath. These symptoms may represent a serious problem that is an emergency. Do not wait to see if the symptoms will go away. Get medical help right away. Call your local emergency services (911 in the [place] Do not drive yourself to the hospital. Summary Labor is your body's natural process of moving your baby and the placenta out of your uterus. The process of labor usually starts when your baby is full-term, between 25 and 40 weeks of pregnancy. When labor starts, or if your water breaks, call your health care provider or nurse care line. Based on your situation, they will determine when you should go in for an exam. This information is not intended to replace advice given to you by your health care provider. Make sure you discuss any questions you have with your health care provider. Document Revised: 10/19/2020 Document Reviewed: 10/19/2020 Elsevier Patient Education  2024 [employer].

## 2024-06-19 NOTE — L&D Delivery Note (Signed)
"     Delivery Note   Cynthia Jackson is a 39 y.o. H4E6986 at [redacted]w[redacted]d Estimated Date of Delivery: 06/22/24  PRE-OPERATIVE DIAGNOSIS:  1) [redacted]w[redacted]d pregnancy.  Induction of Labor for Postdates  POST-OPERATIVE DIAGNOSIS:  1) [redacted]w[redacted]d pregnancy s/p Vaginal, Spontaneous   Delivery Type: Vaginal, Spontaneous   Delivery Anesthesia: Epidural  Labor Complications:  none    ESTIMATED BLOOD LOSS: 400 ml    FINDINGS:   1) female infant, Apgar scores of  8  at 1 minute and   8 at 5 minutes and a birthweight of 3970 gm@ ounces.     SPECIMENS:   PLACENTA:   Appearance: Intact   Removal: Spontaneous     Disposition:  discarded  CORD BLOOD: Collected  DISPOSITION:  Infant left in stable condition in the delivery room, with L&D personnel and mother,  NARRATIVE SUMMARY: Labor course:  Cynthia Jackson is a H4E6986 at [redacted]w[redacted]d who presented to Labor & Delivery for induction of labor. Labor proceeded with augmentation and she was found to be completely dilated at 0231 . With excellent maternal pushing effort, she birthed a viable female infant at 13. There was a nuchal cord. The shoulders were birthed without difficulty. The infant was initially placed skin-to-skin with mother, then taken to warmer for observation. The cord was doubly clamped and cut when pulsations ceased. The placenta delivered spontaneously and was noted to be intact with a 3VC. A perineal and vaginal examination was performed. Lacerations:  2nd degree laceration  Lacerations were repaired with Vicryl suture using epidural anesthesia. The patient tolerated this well. Mother and baby were left in stable condition.  Cynthia Jackson, Student-MidWife  07/01/2024 4:16 AM  "

## 2024-06-20 ENCOUNTER — Encounter: Payer: Self-pay | Admitting: Obstetrics and Gynecology

## 2024-06-20 ENCOUNTER — Observation Stay
Admission: EM | Admit: 2024-06-20 | Discharge: 2024-06-21 | Disposition: A | Discharge status: Home / Self Care | Source: Home / Self Care | Admitting: Obstetrics and Gynecology

## 2024-06-20 DIAGNOSIS — O4703 False labor before 37 completed weeks of gestation, third trimester: Principal | ICD-10-CM | POA: Insufficient documentation

## 2024-06-20 DIAGNOSIS — Z3A39 39 weeks gestation of pregnancy: Secondary | ICD-10-CM | POA: Insufficient documentation

## 2024-06-20 DIAGNOSIS — O479 False labor, unspecified: Principal | ICD-10-CM | POA: Diagnosis present

## 2024-06-21 ENCOUNTER — Other Ambulatory Visit: Payer: Self-pay

## 2024-06-21 ENCOUNTER — Encounter: Payer: Self-pay | Admitting: Obstetrics and Gynecology

## 2024-06-21 DIAGNOSIS — O479 False labor, unspecified: Principal | ICD-10-CM | POA: Diagnosis present

## 2024-06-21 DIAGNOSIS — O4703 False labor before 37 completed weeks of gestation, third trimester: Secondary | ICD-10-CM | POA: Diagnosis present

## 2024-06-21 DIAGNOSIS — Z3A39 39 weeks gestation of pregnancy: Secondary | ICD-10-CM | POA: Diagnosis not present

## 2024-06-21 DIAGNOSIS — R109 Unspecified abdominal pain: Secondary | ICD-10-CM | POA: Diagnosis not present

## 2024-06-21 DIAGNOSIS — O26893 Other specified pregnancy related conditions, third trimester: Secondary | ICD-10-CM

## 2024-06-21 NOTE — OB Triage Note (Signed)
 Cynthia Jackson 39 y.o. @G5P3  @[place]   presents to Labor & Delivery triage via wheelchair steered by ED staff reporting contractions that started around 7:30pm last night. She states that her contractions became stronger and closer together around 9pm . She denies signs and symptoms consistent with rupture of membranes or active vaginal bleeding. She states positive fetal movement. External FM and TOCO applied to non-tender abdomen. Initial FHR 125. Vital signs obtained and within normal limits. Patient oriented to care environment including call bell and bed control use. Lauraine Lakes, CNM notified of patient's arrival.

## 2024-06-21 NOTE — OB Triage Provider Note (Signed)
 LABOR & DELIVERY OB TRIAGE NOTE  SUBJECTIVE  HPI Cynthia Jackson is a 39 y.o. H4E6986 at [redacted]w[redacted]d who presents to Labor & Delivery for labor evaluation. Contractions have become progressively closer. No LOF, bleeding. + FM  OB History     Gravida  5   Para  3   Term  3   Preterm  0   AB  1   Living  3      SAB      IAB      Ectopic  1   Multiple  0   Live Births  3           OBJECTIVE  BP 120/64 (BP Location: Left Arm)   Pulse 77   Temp 97.9 F (36.6 C) (Oral)   Resp 18   LMP 09/16/2023   General: NAD, conversant Heart: Well perfused Lungs: Normal WOB Abdomen: Gravid Cervical exam: Dilation: 1.5 Effacement (%): 50 Station: -3 Presentation: Vertex Exam by:: JONELLE Brain RN   NST I reviewed the NST and it was reactive.  Baseline: 125 Variability: moderate Accelerations: Present Decelerations:none Toco: q 4-6 mins, mild to palp Category 1  No results found for this or any previous visit (from the past 24 hours).  No results found.  ASSESSMENT/ PLAN 1) Pregnancy at H4E6986, [redacted]w[redacted]d, Estimated Date of Delivery: 06/22/24 2) Reassuring maternal/fetal status 3) Early vs false labor. No change SVE in 2 hrs. Pt feels okay about going home. Reviewed labor precautions.  Lauraine Lakes, CNM 06/21/2024  2:29 AM

## 2024-06-30 ENCOUNTER — Inpatient Hospital Stay
Admission: RE | Admit: 2024-06-30 | Discharge: 2024-07-02 | DRG: 807 | Disposition: A | Discharge status: Home / Self Care | Attending: Certified Nurse Midwife | Admitting: Certified Nurse Midwife

## 2024-06-30 ENCOUNTER — Encounter: Payer: Self-pay | Admitting: Obstetrics & Gynecology

## 2024-06-30 ENCOUNTER — Inpatient Hospital Stay: Admitting: Anesthesiology

## 2024-06-30 ENCOUNTER — Other Ambulatory Visit: Payer: Self-pay

## 2024-06-30 ENCOUNTER — Other Ambulatory Visit

## 2024-06-30 ENCOUNTER — Ambulatory Visit: Admitting: Licensed Practical Nurse

## 2024-06-30 VITALS — BP 115/52 | HR 90 | Wt 159.8 lb

## 2024-06-30 DIAGNOSIS — Z3493 Encounter for supervision of normal pregnancy, unspecified, third trimester: Principal | ICD-10-CM

## 2024-06-30 DIAGNOSIS — Z3A41 41 weeks gestation of pregnancy: Secondary | ICD-10-CM

## 2024-06-30 DIAGNOSIS — F17210 Nicotine dependence, cigarettes, uncomplicated: Secondary | ICD-10-CM | POA: Diagnosis present

## 2024-06-30 DIAGNOSIS — Z3403 Encounter for supervision of normal first pregnancy, third trimester: Secondary | ICD-10-CM

## 2024-06-30 DIAGNOSIS — O99334 Smoking (tobacco) complicating childbirth: Secondary | ICD-10-CM | POA: Diagnosis present

## 2024-06-30 DIAGNOSIS — O99344 Other mental disorders complicating childbirth: Secondary | ICD-10-CM | POA: Diagnosis present

## 2024-06-30 DIAGNOSIS — O480 Post-term pregnancy: Secondary | ICD-10-CM | POA: Diagnosis present

## 2024-06-30 DIAGNOSIS — Z3490 Encounter for supervision of normal pregnancy, unspecified, unspecified trimester: Secondary | ICD-10-CM

## 2024-06-30 DIAGNOSIS — F419 Anxiety disorder, unspecified: Secondary | ICD-10-CM | POA: Diagnosis present

## 2024-06-30 DIAGNOSIS — Z86001 Personal history of in-situ neoplasm of cervix uteri: Secondary | ICD-10-CM | POA: Diagnosis not present

## 2024-06-30 LAB — CBC
HCT: 33.7 % — ABNORMAL LOW (ref 36.0–46.0)
Hemoglobin: 11.4 g/dL — ABNORMAL LOW (ref 12.0–15.0)
MCH: 29.8 pg (ref 26.0–34.0)
MCHC: 33.8 g/dL (ref 30.0–36.0)
MCV: 88 fL (ref 80.0–100.0)
Platelets: 210 K/uL (ref 150–400)
RBC: 3.83 MIL/uL — ABNORMAL LOW (ref 3.87–5.11)
RDW: 13.7 % (ref 11.5–15.5)
WBC: 11 K/uL — ABNORMAL HIGH (ref 4.0–10.5)
nRBC: 0 % (ref 0.0–0.2)

## 2024-06-30 LAB — URINE DRUG SCREEN
Amphetamines: NEGATIVE
Barbiturates: NEGATIVE
Benzodiazepines: NEGATIVE
Cocaine: NEGATIVE
Fentanyl: NEGATIVE
Methadone Scn, Ur: NEGATIVE
Opiates: NEGATIVE
Tetrahydrocannabinol: NEGATIVE

## 2024-06-30 LAB — TYPE AND SCREEN
ABO/RH(D): O POS
Antibody Screen: NEGATIVE

## 2024-06-30 MED ORDER — LIDOCAINE HCL (PF) 1 % IJ SOLN
INTRAMUSCULAR | Status: AC
  Filled 2024-06-30: qty 30

## 2024-06-30 MED ORDER — AMMONIA AROMATIC IN INHA
RESPIRATORY_TRACT | Status: AC
  Filled 2024-06-30: qty 10

## 2024-06-30 MED ORDER — LIDOCAINE-EPINEPHRINE (PF) 1.5 %-1:200000 IJ SOLN
INTRAMUSCULAR | Status: DC | PRN
  Administered 2024-06-30: 3 mL via EPIDURAL

## 2024-06-30 MED ORDER — ONDANSETRON HCL 4 MG/2ML IJ SOLN: 4.0000 mg | Freq: Four times a day (QID) | INTRAMUSCULAR | Status: DC | PRN

## 2024-06-30 MED ORDER — OXYTOCIN-SODIUM CHLORIDE 30-0.9 UT/500ML-% IV SOLN
1.0000 m[IU]/min | INTRAVENOUS | Status: DC
  Administered 2024-06-30: 2 m[IU]/min via INTRAVENOUS
  Filled 2024-06-30: qty 500

## 2024-06-30 MED ORDER — LIDOCAINE HCL (PF) 1 % IJ SOLN: 30.0000 mL | INTRAMUSCULAR | Status: DC | PRN

## 2024-06-30 MED ORDER — PHENYLEPHRINE 80 MCG/ML (10ML) SYRINGE FOR IV PUSH (FOR BLOOD PRESSURE SUPPORT): 80.0000 ug | PREFILLED_SYRINGE | INTRAVENOUS | Status: DC | PRN

## 2024-06-30 MED ORDER — OXYTOCIN-SODIUM CHLORIDE 30-0.9 UT/500ML-% IV SOLN: 2.5000 [IU]/h | INTRAVENOUS | Status: DC

## 2024-06-30 MED ORDER — MISOPROSTOL 200 MCG PO TABS
ORAL_TABLET | ORAL | Status: AC
  Filled 2024-06-30: qty 4

## 2024-06-30 MED ORDER — LIDOCAINE HCL (PF) 1 % IJ SOLN
INTRAMUSCULAR | Status: DC | PRN
  Administered 2024-06-30 (×2): 2 mL via SUBCUTANEOUS

## 2024-06-30 MED ORDER — FENTANYL CITRATE (PF) 100 MCG/2ML IJ SOLN: 50.0000 ug | INTRAMUSCULAR | Status: DC | PRN

## 2024-06-30 MED ORDER — EPHEDRINE 5 MG/ML INJ
10.0000 mg | INTRAVENOUS | Status: DC | PRN
  Filled 2024-06-30: qty 5

## 2024-06-30 MED ORDER — MISOPROSTOL 25 MCG QUARTER TABLET
25.0000 ug | ORAL_TABLET | Freq: Once | ORAL | Status: AC
  Administered 2024-06-30: 25 ug via ORAL
  Filled 2024-06-30: qty 1

## 2024-06-30 MED ORDER — MISOPROSTOL 25 MCG QUARTER TABLET
25.0000 ug | ORAL_TABLET | Freq: Once | ORAL | Status: AC
  Administered 2024-06-30: 25 ug via VAGINAL
  Filled 2024-06-30: qty 1

## 2024-06-30 MED ORDER — TERBUTALINE SULFATE 1 MG/ML IJ SOLN: 0.2500 mg | Freq: Once | INTRAMUSCULAR | Status: DC | PRN

## 2024-06-30 MED ORDER — SOD CITRATE-CITRIC ACID 500-334 MG/5ML PO SOLN: 30.0000 mL | ORAL | Status: DC | PRN

## 2024-06-30 MED ORDER — LACTATED RINGERS IV SOLN
500.0000 mL | Freq: Once | INTRAVENOUS | Status: AC
  Administered 2024-06-30: 500 mL via INTRAVENOUS

## 2024-06-30 MED ORDER — LACTATED RINGERS IV SOLN: INTRAVENOUS | Status: DC

## 2024-06-30 MED ORDER — DIPHENHYDRAMINE HCL 50 MG/ML IJ SOLN
12.5000 mg | INTRAMUSCULAR | Status: DC | PRN
  Administered 2024-07-01: 12.5 mg via INTRAVENOUS
  Filled 2024-06-30: qty 1

## 2024-06-30 MED ORDER — SODIUM CHLORIDE 0.9 % IV SOLN
INTRAVENOUS | Status: DC | PRN
  Administered 2024-06-30: 3 mL via EPIDURAL
  Administered 2024-06-30: 5 mL via EPIDURAL

## 2024-06-30 MED ORDER — MISOPROSTOL 25 MCG QUARTER TABLET: 25.0000 ug | ORAL_TABLET | ORAL | Status: DC | PRN

## 2024-06-30 MED ORDER — OXYTOCIN 10 UNIT/ML IJ SOLN
INTRAMUSCULAR | Status: AC
  Filled 2024-06-30: qty 2

## 2024-06-30 MED ORDER — EPHEDRINE 5 MG/ML INJ: 10.0000 mg | INTRAVENOUS | Status: DC | PRN

## 2024-06-30 MED ORDER — LACTATED RINGERS IV SOLN: 500.0000 mL | INTRAVENOUS | Status: DC | PRN

## 2024-06-30 MED ORDER — OXYTOCIN BOLUS FROM INFUSION
333.0000 mL | Freq: Once | INTRAVENOUS | Status: AC
  Administered 2024-07-01: 333 mL via INTRAVENOUS

## 2024-06-30 MED ORDER — FENTANYL-BUPIVACAINE-NACL 0.5-0.125-0.9 MG/250ML-% EP SOLN
12.0000 mL/h | EPIDURAL | Status: DC | PRN
  Administered 2024-06-30: 12 mL/h via EPIDURAL
  Filled 2024-06-30: qty 250

## 2024-06-30 NOTE — Progress Notes (Signed)
 Labor Progress Note   ASSESSMENT/PLAN   Cynthia Jackson 39 y.o.   H4E6986  at [redacted]w[redacted]d here with induction of labor for postdates pregnancy.  FWB:  - Fetal well being assessed: Cat 1      GBS: - GBS Negative/-- (12/11 1216)    LABOR: - Now in latent labor, doing well. - Pain Management: requesting epidural - Discussed options with patient and patient desires an epidural before starting pitocin  - Anticipate SVD     Labor Progress: Dil Eff Sta Exam Time  2 70 -3 2038  2 50 -3 1610     SUBJECTIVE/OBJECTIVE   SUBJECTIVE: Feeling mild contractions    OBJECTIVE: Vital Signs: Patient Vitals for the past 12 hrs:  BP Temp Temp src Pulse Resp Height Weight  06/30/24 1950 (!) 128/50 97.7 F (36.5 C) Oral 94 14 -- --  06/30/24 1804 (!) 115/52 98.8 F (37.1 C) Oral 90 16 5' 1 (1.549 m) 68 kg  06/30/24 1610 (!) 115/52 98.8 F (37.1 C) Oral 90 16 5' 1 (1.549 m) 68 kg  06/30/24 1543 128/83 -- -- (!) 102 -- -- --    Last SVE:  Dilation: 2 Effacement (%): 70 Cervical Position: Middle Station: -3 Presentation: Vertex Exam by:: E. Corrin Sieling, CNM  FHR:   - Mode: External  - Baseline Rate (A): 135 bpm  -  moderate variability  - Characteristics (ie - accels, decels): Accelerations: 15 x 15  -  no decelerations  UTERINE ACTIVITY:   - Mode: Toco  - Contraction Frequency (min): 5-6 minutes Pitocin : 2-3 mU/min

## 2024-06-30 NOTE — Plan of Care (Signed)

## 2024-06-30 NOTE — Progress Notes (Signed)
" ° ° °  Return Prenatal Note   Subjective   39 y.o. H4E6986 at [redacted]w[redacted]d presents for this follow-up prenatal visit.  Patient here with her 2 daughters  Patient reports: doing ok, tired of being pregnant, was hoping for spontaneous labor but now is open to IOL   Movement: Present Contractions: Irritability  Objective   Flow sheet Vitals: Pulse Rate: 90 BP: (!) 115/52 Fundal Height: 41 cm Fetal Heart Rate (bpm): 130 Presentation: Vertex Dilation: 2.5 Effacement (%): 50 Station: -3 Total weight gain: 59 lb 12.8 oz (27.1 kg)   Cynthia Jackson 1986/03/03 [redacted]w[redacted]d  Fetus A Non-Stress Test Interpretation for 06/30/2024  Indication: post dates testing   Fetal Heart Rate A Mode: External Baseline Rate (A): 125 bpm Variability: Moderate Accelerations: 15 x 15 Decelerations: None  Uterine Activity Mode: Toco Contraction Frequency (min): rare Resting Tone Palpated: Relaxed  Interpretation (Fetal Testing) Nonstress Test Interpretation: Reactive    General Appearance  No acute distress, well appearing, and well nourished Pulmonary   Normal work of breathing Neurologic   Alert and oriented to person, place, and time Psychiatric   Mood and affect within normal limits   Assessment/Plan   Plan  39 y.o. H4E6986 at [redacted]w[redacted]d presents for follow-up OB visit. Reviewed prenatal record including previous visit note.  Supervision of low-risk pregnancy -TWG 59lbs, prepregnancy BMI 18  -IOL scheduled for today at 3pm, Method of induction reviewed -RNST       No orders of the defined types were placed in this encounter.  No follow-ups on file.   No future appointments.   For next visit:  IOL today at 3pm      Cynthia Jackson [facility], CNM  1/12/20261:49 PM  "

## 2024-06-30 NOTE — H&P (Signed)
 [facility] Labor & Delivery  History and Physical  ASSESSMENT AND PLAN   Cynthia Jackson is a 39 y.o. H4E6986 at [redacted]w[redacted]d with EDD: 06/22/2024, by Last Menstrual Period admitted for IOL for Post dates at [redacted]w[redacted]d.  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 - Will address in labor if needed  Labs/Immunizations: Prenatal labs and studies: ABO, Rh: O/Positive/-- (06/18 1008) Antibody: Negative (06/18 1008) Rubella: 1.04 (06/18 1008) Varicella: Reactive (06/18 1008)  RPR: Non Reactive (10/09 1002)  HBsAg: Negative (06/18 1008)  HepC: Non Reactive (06/18 1008) HIV: Non Reactive (10/09 1002)  GBS: Negative/-- (12/11 1216)   TDAP: 10/9 Flu: declined RSV: 12/11  Postpartum Plan: - Feeding: Breast Milk - Contraception: plans partner to get vasectomy - Prenatal Care Provider: CNM     HPI   Chief Complaint: Patient here for IOL for Post dates  Cynthia Jackson is a 39 y.o. H4E6986 at [redacted]w[redacted]d who presents for Induction of labor  Patient denies leakage of fluid, bleeding, headache or vision changes.  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   HISTORY    Medications Medications Prior to Admission  Medication Sig Dispense Refill Last Dose/Taking   hydrOXYzine  (ATARAX ) 25 MG tablet Take 1 tablet (25 mg total) by mouth every 6 (six) hours as needed for anxiety. Take 1 tablet (25 mg total) by mouth every 6 (six) hours as needed for anxiety or for sleep 30 tablet 1    prenatal vitamin w/FE, FA (PRENATAL 1 + 1) 27-1 MG TABS tablet Take 1 tablet by mouth daily at 12 noon.       Allergies is allergic to other.   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 She started smoking about 35 years ago. She has a 16 pack-year smoking history. She has never used smokeless tobacco. She reports current drug use. Drug: Marijuana. She reports that she does not drink alcohol.   Family History family history includes Breast cancer in her maternal aunt and maternal grandmother.   PHYSICAL EXAM   There were no vitals filed for this visit.  Constitutional: No acute distress, well appearing, and well nourished. Neurologic: She is alert and conversational.  Psychiatric: She has a normal mood and affect.  Musculoskeletal: Normal gait, grossly normal range of motion Cardiovascular: Normal rate.   Pulmonary/Chest: Normal work of breathing.   Gastrointestinal/Abdominal: Soft. Gravid. There is no tenderness.  Skin: Skin is warm and dry. No rash noted.  Genitourinary: Normal external female genitalia.    NST Interpretation Indication: IOL Baseline: 130 bpm Variability: moderate Accelerations: present Decelerations: none Contractions: irregular, every 1-4 minutes Time noted:  See OBIX Impression: reactive Authenticated by: Leita LILLETTE Camillia Adrien   Attending Emily Slaughterbeck, CNM  immediately available for the care of the patient.   Leita LILLETTE Camillia Adrien, Student-MidWife

## 2024-06-30 NOTE — Assessment & Plan Note (Signed)
-  TWG 59lbs, prepregnancy BMI 18  -IOL scheduled for today at 3pm, Method of induction reviewed -RNST

## 2024-06-30 NOTE — Anesthesia Procedure Notes (Signed)
 Epidural Patient location during procedure: OB End time: 06/30/2024 9:29 PM  Staffing Performed: anesthesiologist   Preanesthetic Checklist Completed: patient identified, IV checked, site marked, risks and benefits discussed, surgical consent, monitors and equipment checked, pre-op evaluation and timeout performed  Epidural Patient position: sitting Prep: Betadine Patient monitoring: heart rate, continuous pulse ox and blood pressure Approach: midline Location: L4-L5 Injection technique: LOR saline  Needle:  Needle type: Tuohy  Needle gauge: 17 G Needle length: 9 cm and 9 Needle insertion depth: 6 cm Catheter type: closed end flexible Catheter size: 19 Gauge Catheter at skin depth: 12 cm Test dose: negative and 1.5% lidocaine  with Epi 1:200 K  Assessment Sensory level: T10 Events: blood not aspirated, no cerebrospinal fluid, injection not painful, no injection resistance, no paresthesia and negative IV test  Additional Notes   Patient tolerated the insertion well without complications.-SATD -IVTD. No paresthesia. Refer to [facility] nursing for VS and dosingReason for block:procedure for pain

## 2024-06-30 NOTE — Anesthesia Preprocedure Evaluation (Signed)
"                                    Anesthesia Evaluation  Patient identified by MRN, date of birth, ID band Patient awake    Reviewed: Allergy & Precautions, H&P , NPO status , Patient's Chart, lab work & pertinent test results, reviewed documented beta blocker date and time   Airway Mallampati: II  TM Distance: >3 FB Neck ROM: full    Dental no notable dental hx. (+) Teeth Intact   Pulmonary neg pulmonary ROS, Current Smoker   Pulmonary exam normal breath sounds clear to auscultation       Cardiovascular Exercise Tolerance: Good negative cardio ROS  Rhythm:regular Rate:Normal     Neuro/Psych   Anxiety     negative neurological ROS  negative psych ROS   GI/Hepatic negative GI ROS, Neg liver ROS,,,  Endo/Other  negative endocrine ROSdiabetes, Well Controlled, Gestational    Renal/GU      Musculoskeletal   Abdominal   Peds  Hematology negative hematology ROS (+)   Anesthesia Other Findings   Reproductive/Obstetrics (+) Pregnancy                              Anesthesia Physical Anesthesia Plan  ASA: 2  Anesthesia Plan: Epidural   Post-op Pain Management:    Induction:   PONV Risk Score and Plan:   Airway Management Planned:   Additional Equipment:   Intra-op Plan:   Post-operative Plan:   Informed Consent: I have reviewed the patients History and Physical, chart, labs and discussed the procedure including the risks, benefits and alternatives for the proposed anesthesia with the patient or authorized representative who has indicated his/her understanding and acceptance.       Plan Discussed with:   Anesthesia Plan Comments:         Anesthesia Quick Evaluation  "

## 2024-07-01 ENCOUNTER — Encounter: Payer: Self-pay | Admitting: Obstetrics & Gynecology

## 2024-07-01 DIAGNOSIS — F419 Anxiety disorder, unspecified: Secondary | ICD-10-CM

## 2024-07-01 DIAGNOSIS — O9A113 Malignant neoplasm complicating pregnancy, third trimester: Secondary | ICD-10-CM

## 2024-07-01 DIAGNOSIS — O99344 Other mental disorders complicating childbirth: Secondary | ICD-10-CM

## 2024-07-01 DIAGNOSIS — C539 Malignant neoplasm of cervix uteri, unspecified: Secondary | ICD-10-CM

## 2024-07-01 LAB — SYPHILIS: RPR W/REFLEX TO RPR TITER AND TREPONEMAL ANTIBODIES, TRADITIONAL SCREENING AND DIAGNOSIS ALGORITHM: RPR Ser Ql: NONREACTIVE

## 2024-07-01 MED ORDER — WITCH HAZEL-GLYCERIN EX PADS
MEDICATED_PAD | CUTANEOUS | Status: AC
  Filled 2024-07-01: qty 100

## 2024-07-01 MED ORDER — OXYTOCIN-SODIUM CHLORIDE 30-0.9 UT/500ML-% IV SOLN: 2.5000 [IU]/h | INTRAVENOUS | Status: DC | PRN

## 2024-07-01 MED ORDER — TETANUS-DIPHTH-ACELL PERTUSSIS 5-2-15.5 LF-MCG/0.5 IM SUSP: 0.5000 mL | Freq: Once | INTRAMUSCULAR | Status: DC

## 2024-07-01 MED ORDER — ACETAMINOPHEN 325 MG PO TABS: 650.0000 mg | ORAL_TABLET | ORAL | Status: DC | PRN

## 2024-07-01 MED ORDER — BENZOCAINE-MENTHOL 20-0.5 % EX AERO
INHALATION_SPRAY | CUTANEOUS | Status: AC
  Administered 2024-07-01: 1 via TOPICAL
  Filled 2024-07-01: qty 56

## 2024-07-01 MED ORDER — PRENATAL MULTIVITAMIN CH: 1.0000 | ORAL_TABLET | Freq: Every day | ORAL | Status: DC

## 2024-07-01 MED ORDER — ZOLPIDEM TARTRATE 5 MG PO TABS: 5.0000 mg | ORAL_TABLET | Freq: Every evening | ORAL | Status: DC | PRN

## 2024-07-01 MED ORDER — BENZOCAINE-MENTHOL 20-0.5 % EX AERO: 1.0000 | INHALATION_SPRAY | CUTANEOUS | Status: DC | PRN

## 2024-07-01 MED ORDER — IBUPROFEN 600 MG PO TABS
600.0000 mg | ORAL_TABLET | Freq: Four times a day (QID) | ORAL | Status: DC
  Administered 2024-07-01 – 2024-07-02 (×5): 600 mg via ORAL
  Filled 2024-07-01 (×4): qty 1

## 2024-07-01 MED ORDER — DIPHENHYDRAMINE HCL 25 MG PO CAPS: 25.0000 mg | ORAL_CAPSULE | Freq: Four times a day (QID) | ORAL | Status: DC | PRN

## 2024-07-01 MED ORDER — SIMETHICONE 80 MG PO CHEW: 80.0000 mg | CHEWABLE_TABLET | ORAL | Status: DC | PRN

## 2024-07-01 MED ORDER — WITCH HAZEL-GLYCERIN EX PADS: MEDICATED_PAD | CUTANEOUS | Status: DC | PRN

## 2024-07-01 MED ORDER — DOCUSATE SODIUM 100 MG PO CAPS
100.0000 mg | ORAL_CAPSULE | Freq: Two times a day (BID) | ORAL | Status: DC
  Administered 2024-07-01: 100 mg via ORAL
  Filled 2024-07-01: qty 1

## 2024-07-01 MED ORDER — IBUPROFEN 600 MG PO TABS
ORAL_TABLET | ORAL | Status: AC
  Filled 2024-07-01: qty 1

## 2024-07-01 MED ORDER — OXYCODONE-ACETAMINOPHEN 5-325 MG PO TABS: 1.0000 | ORAL_TABLET | ORAL | Status: DC | PRN

## 2024-07-01 NOTE — Plan of Care (Signed)

## 2024-07-01 NOTE — Discharge Summary (Signed)
 "    Postpartum Discharge Summary  Date of Service updated***     Patient Name: Cynthia Jackson DOB: 12-21-1985 MRN: [id number]  Date of admission: 06/30/2024 Delivery date:07/01/2024 Delivering provider: MACY PERKINS Date of discharge: 07/01/2024  Admitting diagnosis: Encounter for induction of labor [Z34.90] Intrauterine pregnancy: [redacted]w[redacted]d     Secondary diagnosis:  Principal Problem:   Encounter for induction of labor  Additional problems: ***    Discharge diagnosis: Term Pregnancy Delivered                                              Post partum procedures:{Postpartum procedures:23558} Augmentation: AROM, Pitocin , and Cytotec  Complications: None  Hospital course: Induction of Labor With Vaginal Delivery   39 y.o. yo [id number] at [redacted]w[redacted]d was admitted to the hospital 06/30/2024 for induction of labor.  Indication for induction: Postdates.  Patient had an labor course complicated by none. Membrane Rupture Time/Date: 12:45 AM,07/01/2024  Delivery Method:Vaginal, Spontaneous Operative Delivery:N/A Episiotomy: None Lacerations:  2nd degree;Perineal Details of delivery can be found in separate delivery note.  Patient had a postpartum course complicated by***. Patient is discharged home 07/01/2024.  Newborn Data: Birth date:07/01/2024 Birth time:3:34 AM Gender:Female Living status:Living Apgars:7 ,8  Weight:3970 g  Magnesium Sulfate received: No BMZ received: No Rhophylac:N/A MMR:No T-DaP:Given prenatally Flu: No RSV Vaccine received: No Transfusion:{Transfusion received:30440034} Immunizations administered: Immunization History  Administered Date(s) Administered    sv, Bivalent, Protein Subunit Rsvpref,pf (Abrysvo) 05/29/2024   HPV 9-valent 10/02/2019   Influenza,inj,Quad PF,6+ Mos 05/15/2017   Tdap 07/05/2016, 11/15/2021, 03/27/2024    Physical exam  Vitals:   07/01/24 0030 07/01/24 0041 07/01/24 0044 07/01/24 0233  BP:  107/60    Pulse:  68    Resp: 16      Temp: 98.1 F (36.7 C)   98.1 F (36.7 C)  TempSrc: Oral   Oral  SpO2:   100%   Weight:      Height:       General: {Exam; general:21111117} Lochia: {Desc; appropriate/inappropriate:30686::appropriate} Uterine Fundus: {Desc; firm/soft:30687} Incision: {Exam; incision:21111123} DVT Evaluation: {Exam; dvt:2111122} Labs: Lab Results  Component Value Date   WBC 11.0 (H) 06/30/2024   HGB 11.4 (L) 06/30/2024   HCT 33.7 (L) 06/30/2024   MCV 88.0 06/30/2024   PLT 210 06/30/2024      Latest Ref Rng & Units 01/31/2021    6:28 PM  CMP  Glucose 70 - 99 mg/dL 892   BUN 6 - 20 mg/dL 10   Creatinine 9.55 - 1.00 mg/dL 9.27   Sodium 864 - 854 mmol/L 137   Potassium 3.5 - 5.1 mmol/L 3.4   Chloride 98 - 111 mmol/L 108   CO2 22 - 32 mmol/L 23   Calcium 8.9 - 10.3 mg/dL 9.2   Total Protein 6.5 - 8.1 g/dL 8.0   Total Bilirubin 0.3 - 1.2 mg/dL 1.3   Alkaline Phos 38 - 126 U/L 48   AST 15 - 41 U/L 20   ALT 0 - 44 U/L 21    Edinburgh Score:    03/27/2024    9:16 AM  Edinburgh Postnatal Depression Scale Screening Tool  I have been able to laugh and see the funny side of things. 0   I have looked forward with enjoyment to things. 0   I have blamed myself unnecessarily when things went wrong. 0   I  have been anxious or worried for no good reason. 0   I have felt scared or panicky for no good reason. 0   Things have been getting on top of me. 0   I have been so unhappy that I have had difficulty sleeping. 0   I have felt sad or miserable. 0   I have been so unhappy that I have been crying. 0   The thought of harming myself has occurred to me. 0   Edinburgh Postnatal Depression Scale Total 0     Data saved with a previous flowsheet row definition      After visit meds:  Allergies as of 07/01/2024       Reactions   Other Anaphylaxis   Fresh fruit and vegetables allergy-melon worst     Med Rec must be completed prior to using this [facility]***        Discharge home in  stable condition Infant Feeding: Breast Infant Disposition:home with mother Discharge instruction: per After Visit Summary and Postpartum booklet. Activity: Advance as tolerated. Pelvic rest for 6 weeks.  Diet: {OB diet:21111121} Anticipated Birth Control: vasectomy Postpartum Appointment:{Outpatient follow up:23559} Additional Postpartum F/U: {PP Procedure:23957} Future Appointments:No future appointments. Follow up Visit:  Follow-up Information     Chayse Gracey, Damien, CNM Follow up.   Specialty: Certified Nurse Midwife Why: 2 week virtual visit 6 week postpartum visit Contact information: [address] Michaela Solon [place] [address] [phone]                     07/01/2024 Damien PARSLEY, CNM   "

## 2024-07-01 NOTE — Progress Notes (Signed)
 Social Rounds S/p SVB 1/13 at 0334 Pt doing well. Happy to have given birth.  Breastfeeding independently, has some cramping with nursing. Ambulating and urinating without difficulty, desires IV to be removed. Her partner is at her side. Pt desires to be discharged first thing in the morning. Jinnie Cookey, CNM  [facility] 07/02/2023 6:45 PM

## 2024-07-01 NOTE — Progress Notes (Signed)
 Labor Progress Note   ASSESSMENT/PLAN   AILYNN GOW 39 y.o.   [id number]  at [redacted]w[redacted]d here with IOL for postdates in pregnancy  FWB:  - Fetal well being assessed: cat 1    GBS: - GBS Negative/-- (12/11 1216)    LABOR: - Now in active labor, doing well. - Pain Management: comfortable with epidural - Discussed options with patient and completed amniotomy, ruptured clear fluid - Anticipate SVD    Labor Progress: Cervical Exam  Last 3 exams Dil Eff Sta Exam Time  6 80 0 0040  2.5 70 -3 2241  2 70 -3 2038    SUBJECTIVE/OBJECTIVE   SUBJECTIVE: Patient reports feeling mild pressure that comes and goes, feels mild itching from epidural would not like medication at this time to help with itchiness    OBJECTIVE: Vital Signs: Patient Vitals for the past 12 hrs:  BP Temp Temp src Pulse Resp SpO2 Height Weight  07/01/24 0044 -- -- -- -- -- 100 % -- --  07/01/24 0041 107/60 -- -- 68 -- -- -- --  07/01/24 0030 -- 98.1 F (36.7 C) Oral -- 16 -- -- --  06/30/24 2344 -- -- -- -- -- 98 % -- --  06/30/24 2341 107/63 -- -- 70 -- -- -- --  06/30/24 2244 -- -- -- -- -- 100 % -- --  06/30/24 2241 (!) 121/59 -- -- 73 -- -- -- --  06/30/24 2229 -- -- -- -- -- 100 % -- --  06/30/24 2226 115/63 -- -- 78 -- -- -- --  06/30/24 2141 (!) 110/52 -- -- -- -- -- -- --  06/30/24 2136 (!) 118/58 -- -- 79 -- -- -- --  06/30/24 2131 122/61 -- -- 81 -- -- -- --  06/30/24 2129 -- -- -- -- -- 99 % -- --  06/30/24 2126 115/65 -- -- 78 -- -- -- --  06/30/24 2124 116/70 -- -- 90 -- 99 % -- --  06/30/24 2119 123/64 -- -- 94 -- 100 % -- --  06/30/24 1950 (!) 128/50 97.7 F (36.5 C) Oral 94 14 -- -- --  06/30/24 1804 (!) 115/52 98.8 F (37.1 C) Oral 90 16 -- 5' 1 (1.549 m) 68 kg  06/30/24 1610 (!) 115/52 98.8 F (37.1 C) Oral 90 16 -- 5' 1 (1.549 m) 68 kg  06/30/24 1543 128/83 -- -- (!) 102 -- -- -- --    Last SVE:  Dilation: 6 Effacement (%): 80 Cervical Position: Posterior Station:  0 Presentation: Vertex Exam by:: FREDRIK Ards, CNM student -  , Rupture Date: 07/01/24, Rupture Time: 0045,    FHR:   - Mode: External  - Baseline Rate (A): 145 bpm (FHT)  -  moderate variability  - Characteristics (ie - accels, decels): Accelerations: 15 x 15  -  occasional variable decelerations  UTERINE ACTIVITY:   - Mode: Toco  - Contraction Frequency (min): 1.5-3 minutes Pitocin : 8 mU/min

## 2024-07-01 NOTE — Lactation Note (Signed)
 This note was copied from a baby's chart. Lactation Consultation Note  Patient Name: Cynthia Jackson Today's Date: 07/01/2024 Age:37 hours Reason for consult: Initial assessment   Maternal Data Has patient been taught Hand Expression?: No Does the patient have breastfeeding experience prior to this delivery?: Yes How long did the patient breastfeed?: 4 Months (Pt. mentioned breastfeeding for four months with her previous children.)  This was an initial consultation with mom, dad, and baby (11 hours old). Mom denied any nipple pain with latching, and described overall comfort and confidence with breastfeeding baby. Mom mentioned that during her previous breastfeeding experience, her milk supply declined around four months. Mom expressed a desire to breastfeed as long as she could.  Feeding Mother's Current Feeding Choice: Breast Milk  Baby was exhibiting hunger cues as LC Student entered the room. Mother latched baby on the right breast while LC Student was present and providing education to mom and dad.   LATCH Score Latch: Grasps breast easily, tongue down, lips flanged, rhythmical sucking.  Audible Swallowing: Spontaneous and intermittent  Type of Nipple: Everted at rest and after stimulation  Comfort (Breast/Nipple): Soft / non-tender  Hold (Positioning): No assistance needed to correctly position infant at breast.  LATCH Score: 10  Mom successfully latched baby with no assistance from the [facility] Student.  Interventions Interventions: Breast feeding basics reviewed;Hand express;Breast massage;Support pillows;Education  LC Student provided education related to cluster feeding, hunger cues in newborns, the importance of feeding baby 8-12 times per day, and supply and demand. Information was also provided about the importance of baby initiating a deep latch on the breast, and displaying flanged lips.  Consult Status Consult Status: PRN  Mom and dad were provided the  in-patient lactation phone number and asked to call for support when needed.  Lillyanne Bradburn [place] 07/01/2024, 2:55 PM

## 2024-07-02 NOTE — Progress Notes (Signed)
 Patient discharged home with family. Discharge instructions, when to follow up, and prescriptions reviewed with patient. Patient verbalized understanding. Patient will be escorted out by auxiliary.

## 2024-07-02 NOTE — Anesthesia Postprocedure Evaluation (Signed)
"   Anesthesia Post Note  Patient: Cynthia Jackson  Procedure(s) Performed: AN AD HOC LABOR EPIDURAL  Patient location during evaluation: Mother Baby Anesthesia Type: Epidural Level of consciousness: awake and alert Pain management: pain level controlled Vital Signs Assessment: post-procedure vital signs reviewed and stable Respiratory status: spontaneous breathing, nonlabored ventilation and respiratory function stable Cardiovascular status: stable Postop Assessment: no headache, no backache and epidural receding Anesthetic complications: no   No notable events documented.   Last Vitals:  Vitals:   07/01/24 2009 07/01/24 2259  BP: (!) 100/59 108/63  Pulse: 91 72  Resp: 18 18  Temp: (!) 36.4 C 36.8 C  SpO2: 98% 99%    Last Pain:  Vitals:   07/02/24 0757  TempSrc:   PainSc: 0-No pain                 Oley Lahaie B Ezmae Speers      "

## 2024-07-02 NOTE — Lactation Note (Signed)
 This note was copied from a baby's chart. Lactation Consultation Note  Patient Name: Cynthia Jackson Unijb'd Date: 07/02/2024 Age:37 hours Reason for consult: Follow-up assessment;Other (Comment) (Discharge Education)   Maternal Data LC to room for follow up assessment and provide discharge education.  Mom nursing infant upon entry into room.  Mom verbalized to [facility] that she was concerned that w/ her last 2 kids she stopped nursing around 3/19months because her milk supply just stopped and she doesn't know why. Where as with her first child she had an abundance of milk  Mom also verbalized that her nipples were a little sore or felt raw.   Feeding Mother's Current Feeding Choice: Breast Milk  Infant wide open gape and actively feeding at the breast.  Mom independently latching infant and removing infant from breast.  LATCH Score Latch: Grasps breast easily, tongue down, lips flanged, rhythmical sucking.  Audible Swallowing: Spontaneous and intermittent  Type of Nipple: Everted at rest and after stimulation  Comfort (Breast/Nipple): Soft / non-tender  Hold (Positioning): No assistance needed to correctly position infant at breast.  LATCH Score: 10   Lactation Tools Discussed/Used Mom provided w/ a Medela Hand Pump.  Given education on how to use and when to use.  LC also provided mom w/ a Medicaid form so she can order a breastpump.  Interventions Interventions: Hand pump;Education;CDC milk storage guidelines  LC gave education on nipple care and recommended using coconut oil for sore or raw nipples.  Discharge Discharge Education: Engorgement and breast care;Outpatient recommendation  Education on engorgement prevention/treatment was discussed as well as breastmilk storage guidelines.  LC provided patient with a handout on breastmilk storage guidelines from [facility]. [facility] outpatient lactation services phone number written on the white board in the room.  Patient verbalized  understanding.  Consult Status Consult Status: Complete    [name] [name] 07/02/2024, 12:30 PM

## 2024-07-17 ENCOUNTER — Telehealth: Admitting: Certified Nurse Midwife

## 2024-07-17 ENCOUNTER — Telehealth: Payer: Self-pay | Admitting: Certified Nurse Midwife

## 2024-07-17 NOTE — Telephone Encounter (Signed)
 Reached out to pt about MyChart video visit that was scheduled on 07/17/2024 at 10:55 with [facility]  Left message for pt to call back about visit.

## 2024-07-18 ENCOUNTER — Encounter: Payer: Self-pay | Admitting: Certified Nurse Midwife

## 2024-07-18 NOTE — Telephone Encounter (Signed)
 Reached out to pt (2x) about MyChart video visit that was scheduled on 06/27/2024 at 10:55 with [facility].  Left message for pt to call back to reschedule.  Will send a MyChart letter to pt.

## 2024-08-13 ENCOUNTER — Ambulatory Visit: Admitting: Certified Nurse Midwife
